# Patient Record
Sex: Female | Born: 1960 | Race: White | Hispanic: No | Marital: Married | Smoking: Never smoker
Health system: Southern US, Community
[De-identification: ages and names within clinical notes are randomized; demographics above are authoritative.]

## PROBLEM LIST (undated history)

## (undated) DIAGNOSIS — E785 Hyperlipidemia, unspecified: Secondary | ICD-10-CM

## (undated) DIAGNOSIS — Z8619 Personal history of other infectious and parasitic diseases: Secondary | ICD-10-CM

## (undated) HISTORY — DX: Hyperlipidemia, unspecified: E78.5

## (undated) HISTORY — PX: COLONOSCOPY: SHX174

## (undated) HISTORY — PX: ABLATION: SHX5711

## (undated) HISTORY — DX: Personal history of other infectious and parasitic diseases: Z86.19

## (undated) HISTORY — PX: TUBAL LIGATION: SHX77

## (undated) HISTORY — PX: OVARIAN CYST REMOVAL: SHX89

---

## 1999-01-31 ENCOUNTER — Other Ambulatory Visit: Admission: RE | Admit: 1999-01-31 | Discharge: 1999-01-31 | Payer: Self-pay | Admitting: Family Medicine

## 2000-05-31 ENCOUNTER — Encounter (INDEPENDENT_AMBULATORY_CARE_PROVIDER_SITE_OTHER): Payer: Self-pay

## 2000-05-31 ENCOUNTER — Inpatient Hospital Stay (HOSPITAL_COMMUNITY): Admission: AD | Admit: 2000-05-31 | Discharge: 2000-06-02 | Payer: Self-pay | Admitting: Obstetrics and Gynecology

## 2000-07-10 ENCOUNTER — Other Ambulatory Visit: Admission: RE | Admit: 2000-07-10 | Discharge: 2000-07-10 | Payer: Self-pay | Admitting: Obstetrics & Gynecology

## 2000-07-28 ENCOUNTER — Encounter: Admission: RE | Admit: 2000-07-28 | Discharge: 2000-10-06 | Payer: Self-pay | Admitting: Obstetrics and Gynecology

## 2000-10-25 ENCOUNTER — Encounter: Admission: RE | Admit: 2000-10-25 | Discharge: 2000-11-24 | Payer: Self-pay | Admitting: Obstetrics and Gynecology

## 2002-05-03 ENCOUNTER — Other Ambulatory Visit: Admission: RE | Admit: 2002-05-03 | Discharge: 2002-05-03 | Payer: Self-pay | Admitting: Obstetrics and Gynecology

## 2002-10-06 ENCOUNTER — Other Ambulatory Visit: Admission: RE | Admit: 2002-10-06 | Discharge: 2002-10-06 | Payer: Self-pay | Admitting: Obstetrics and Gynecology

## 2003-06-22 ENCOUNTER — Other Ambulatory Visit: Admission: RE | Admit: 2003-06-22 | Discharge: 2003-06-22 | Payer: Self-pay | Admitting: Obstetrics and Gynecology

## 2004-07-11 ENCOUNTER — Other Ambulatory Visit: Admission: RE | Admit: 2004-07-11 | Discharge: 2004-07-11 | Payer: Self-pay | Admitting: Obstetrics and Gynecology

## 2005-07-23 ENCOUNTER — Other Ambulatory Visit: Admission: RE | Admit: 2005-07-23 | Discharge: 2005-07-23 | Payer: Self-pay | Admitting: Obstetrics and Gynecology

## 2005-08-07 ENCOUNTER — Encounter (INDEPENDENT_AMBULATORY_CARE_PROVIDER_SITE_OTHER): Payer: Self-pay | Admitting: Specialist

## 2005-08-08 ENCOUNTER — Ambulatory Visit (HOSPITAL_COMMUNITY): Admission: RE | Admit: 2005-08-08 | Discharge: 2005-08-08 | Payer: Self-pay | Admitting: Obstetrics and Gynecology

## 2006-10-23 ENCOUNTER — Encounter (INDEPENDENT_AMBULATORY_CARE_PROVIDER_SITE_OTHER): Payer: Self-pay | Admitting: Obstetrics and Gynecology

## 2006-10-23 ENCOUNTER — Ambulatory Visit (HOSPITAL_COMMUNITY): Admission: RE | Admit: 2006-10-23 | Discharge: 2006-10-24 | Payer: Self-pay | Admitting: Obstetrics and Gynecology

## 2010-03-26 ENCOUNTER — Ambulatory Visit: Payer: Self-pay | Admitting: Family Medicine

## 2010-03-26 ENCOUNTER — Encounter (INDEPENDENT_AMBULATORY_CARE_PROVIDER_SITE_OTHER): Payer: Self-pay | Admitting: *Deleted

## 2010-03-26 DIAGNOSIS — R0789 Other chest pain: Secondary | ICD-10-CM

## 2010-03-26 DIAGNOSIS — E785 Hyperlipidemia, unspecified: Secondary | ICD-10-CM | POA: Insufficient documentation

## 2010-03-26 DIAGNOSIS — Z9189 Other specified personal risk factors, not elsewhere classified: Secondary | ICD-10-CM | POA: Insufficient documentation

## 2010-03-29 ENCOUNTER — Ambulatory Visit: Payer: Self-pay | Admitting: Family Medicine

## 2010-03-29 ENCOUNTER — Encounter (INDEPENDENT_AMBULATORY_CARE_PROVIDER_SITE_OTHER): Payer: Self-pay

## 2010-03-29 LAB — CONVERTED CEMR LAB
Albumin: 3.9 g/dL (ref 3.5–5.2)
BUN: 16 mg/dL (ref 6–23)
CO2: 31 meq/L (ref 19–32)
Calcium: 10.1 mg/dL (ref 8.4–10.5)
Cholesterol: 209 mg/dL — ABNORMAL HIGH (ref 0–200)
Creatinine, Ser: 0.6 mg/dL (ref 0.4–1.2)
Direct LDL: 123.7 mg/dL
Glucose, Bld: 99 mg/dL (ref 70–99)
Total CHOL/HDL Ratio: 3
Total Protein: 6.8 g/dL (ref 6.0–8.3)
Triglycerides: 39 mg/dL (ref 0.0–149.0)

## 2010-04-02 ENCOUNTER — Ambulatory Visit: Payer: Self-pay | Admitting: Internal Medicine

## 2010-04-08 ENCOUNTER — Ambulatory Visit: Payer: Self-pay | Admitting: Internal Medicine

## 2010-07-09 NOTE — Letter (Signed)
Summary: Pre Visit Letter Revised  [facility]  [address] [place], [place] [address]   Phone: [phone]  Fax: [phone]        03/26/2010 MRN: [id number] Cristina Hayes [address] [place]  [address]             Procedure Date:  04/08/10  Welcome to the Gastroenterology Division at [facility].    You are scheduled to see a nurse for your pre-procedure visit on 04/02/10 at 2:30 PM on the 3rd floor at [facility], [address]  We ask that you try to arrive at our office 15 minutes prior to your appointment time to allow for check-in.  Please take a minute to review the attached form.  If you answer "Yes" to one or more of the questions on the first page, we ask that you call the person listed at your earliest opportunity.  If you answer "No" to all of the questions, please complete the rest of the form and bring it to your appointment.    Your nurse visit will consist of discussing your medical and surgical history, your immediate family medical history, and your medications.   If you are unable to list all of your medications on the form, please bring the medication bottles to your appointment and we will list them.  We will need to be aware of both prescribed and over the counter drugs.  We will need to know exact dosage information as well.    Please be prepared to read and sign documents such as consent forms, a financial agreement, and acknowledgement forms.  If necessary, and with your consent, a friend or relative is welcome to sit-in on the nurse visit with you.  Please bring your insurance card so that we may make a copy of it.  If your insurance requires a referral to see a specialist, please bring your referral form from your primary care physician.  No co-pay is required for this nurse visit.     If you cannot keep your appointment, please call [phone] to cancel or reschedule prior to your appointment date.  This allows  [place] the opportunity to schedule an appointment for another patient in need of care.    Thank you for choosing [facility] for your medical needs.  We appreciate the opportunity to care for you.  Please visit [place] at our website  to learn more about our practice.  Sincerely, The Gastroenterology Division

## 2010-07-09 NOTE — Letter (Signed)
Summary: [facility] Instructions  [facility]  [address] [place], [place] [address]   Phone: [phone]  Fax: [phone]       Cristina Hayes    09-06-1960    MRN: [id number]       Procedure Day /Date: Monday 04-08-10     Arrival Time:  8:30 am     Procedure Time: 9:30 am     Location of Procedure:                    _x _   [facility]oscopy Center (4th Floor)   PREPARATION FOR COLONOSCOPY WITH MIRALAX  Starting 5 days prior to your procedure  04-03-10 do not eat nuts, seeds, popcorn, corn, beans, peas,  salads, or any raw vegetables.  Do not take any fiber supplements (e.g. Metamucil, Citrucel, and Benefiber). ____________________________________________________________________________________________________   THE DAY BEFORE YOUR PROCEDURE         DATE:  04-07-10  DAY:  Sunday  1   Drink clear liquids the entire day-NO SOLID FOOD  2   Do not drink anything colored red or purple.  Avoid juices with pulp.  No orange juice.  3   Drink at least 64 oz. (8 glasses) of fluid/clear liquids during the day to prevent dehydration and help the prep work efficiently.  CLEAR LIQUIDS INCLUDE: Water Jello Ice Popsicles Tea (sugar ok, no milk/cream) Powdered fruit flavored drinks Coffee (sugar ok, no milk/cream) Gatorade Juice: apple, white grape, white cranberry  Lemonade Clear bullion, consomm, broth Carbonated beverages (any kind) Strained chicken noodle soup Hard Candy  4   Mix the entire bottle of Miralax with 64 oz. of Gatorade/Powerade in the morning and put in the refrigerator to chill.  5   At 3:00 pm take 2 Dulcolax/Bisacodyl tablets.  6   At 4:30 pm take one Reglan/Metoclopramide tablet.  7  Starting at 5:00 pm drink one 8 oz glass of the Miralax mixture every 15-20 minutes until you have finished drinking the entire 64 oz.  You should finish drinking prep around 7:30 or 8:00 pm.  8   If you are nauseated, you may take the 2nd  Reglan/Metoclopramide tablet at 6:30 pm.        9    At 8:00 pm take 2 more DULCOLAX/Bisacodyl tablets.     THE DAY OF YOUR PROCEDURE      DATE:   04-08-10  DAY:  Monday  You may drink clear liquids until  7:30 a.m. (2 HOURS BEFORE PROCEDURE).   MEDICATION INSTRUCTIONS  Unless otherwise instructed, you should take regular prescription medications with a small sip of water as early as possible the morning of your procedure.         OTHER INSTRUCTIONS  You will need a responsible adult at least 52 years of age to accompany you and drive you home.   This person must remain in the waiting room during your procedure.  Wear loose fitting clothing that is easily removed.  Leave jewelry and other valuables at home.  However, you may wish to bring a book to read or an iPod/MP3 player to listen to music as you wait for your procedure to start.  Remove all body piercing jewelry and leave at home.  Total time from sign-in until discharge is approximately 2-3 hours.  You should go home directly after your procedure and rest.  You can resume normal activities the day after your procedure.  The day of your procedure you should  not:   Drive   Make legal decisions   Operate machinery   Drink alcohol   Return to work  You will receive specific instructions about eating, activities and medications before you leave.   The above instructions have been reviewed and explained to me by   Ulis Rias RN  April 02, 2010 3:01 PM     I fully understand and can verbalize these instructions _____________________________ Date _______

## 2010-07-09 NOTE — Procedures (Signed)
Summary: Colonoscopy  Patient: [name] Note: All result statuses are Final unless otherwise noted.  Tests: (1) Colonoscopy (COL)   COL Colonoscopy           DONE     [facility]     [address]     [place], [place]  [address]           COLONOSCOPY PROCEDURE REPORT           PATIENT:  Cristina Hayes, Cristina Hayes  MR#:  [id number]     BIRTHDATE:  Sep 23, 1960, 49 yrs. old  GENDER:  female     ENDOSCOPIST:  Hedwig Morton. Juanda Chance, MD     REF. BY:  Excell Seltzer, M.D.     PROCEDURE DATE:  04/08/2010     PROCEDURE:  Colonoscopy [address]78[address]     ASA CLASS:  Class I     INDICATIONS:  Routine Risk Screening     MEDICATIONS:   Versed 7 mg, Fentanyl 50 mcg           DESCRIPTION OF PROCEDURE:   After the risks benefits and     alternatives of the procedure were thoroughly explained, informed     consent was obtained.  Digital rectal exam was performed and     revealed no rectal masses.   The LB [id number] endoscope     was introduced through the anus and advanced to the cecum, which     was identified by both the appendix and ileocecal valve, without     limitations.  The quality of the prep was excellent, using     MiraLax.  The instrument was then slowly withdrawn as the colon     was fully examined.     <<PROCEDUREIMAGES>>           FINDINGS:  No polyps or cancers were seen (see image1, image2, and     image3).   Retroflexed views in the rectum revealed no     abnormalities.    The scope was then withdrawn from the patient     and the procedure completed.           COMPLICATIONS:  None     ENDOSCOPIC IMPRESSION:     1) No polyps or cancers     2) Normal colonoscopy     RECOMMENDATIONS:     1) high fiber diet     REPEAT EXAM:  In 10 year(s) for.           ______________________________     Hedwig Morton. Juanda Chance, MD           CC:           n.     eSIGNED:   Hedwig Morton. Brodie at 04/08/2010 10:05 AM           Blanche East, [phone]  Note: An exclamation mark (!) indicates a  result that was not dispersed into the flowsheet. Document Creation Date: 04/08/2010 10:07 AM _______________________________________________________________________  (1) Order result status: Final Collection or observation date-time: 04/08/2010 10:01 Requested date-time:  Receipt date-time:  Reported date-time:  Referring Physician:   Ordering Physician: Lina Sar [phone]) Specimen Source:  Source: Launa Grill Order Number: [id number] Lab site:   Appended Document: Colonoscopy    Clinical Lists Changes  Observations: Added new observation of COLONNXTDUE: 03/2020 (04/08/2010 13:38)      Appended Document: Immunization Entry     Last Colonoscopy:  DONE (04/08/2010 10:01:00 AM) Colonoscopy Next Due:  10  yr

## 2010-07-09 NOTE — Assessment & Plan Note (Signed)
Summary: NEW PT TO ESTABH   Vital Signs:  Patient profile:   50 year old female Height:      64 inches Weight:      154.0 pounds BMI:     26.53 Temp:     99.1 degrees F oral Pulse rate:   72 / minute Pulse rhythm:   regular BP sitting:   110 / 70  Vitals Entered By: Benny Lennert CMA Duncan Dull) (March 26, 2010 10:02 AM) CC: NEW PATIENT    History of Present Illness: Here to establish.   High chlesterol..last checked several years ago at work.  Preventive Screening-Counseling & Management  Alcohol-Tobacco     Smoking Status: never  Caffeine-Diet-Exercise     Does Patient Exercise: yes      Drug Use:  no.    Allergies (verified): No Known Drug Allergies  Past History:  Past Medical History: Current Problems:  CHICKENPOX, HX OF (ICD-V15.9)   Hyperlipidemia  Past Surgical History: ovarian cyst, left ovary removal 2008 novasure (ablition) 2006  childbirth [id number]  Family History: father: COPD, pancreatic cancer mother:HTN siblings: HTN  Social History: 3 children..healthy Occupation: Research officer, trade [employer] Married Never Smoked Alcohol use-no Drug use-no Regular exercise-yes, 1 mile walking a day Diet: fruits and veggies.Marland Kitchendoes eat a lot of fried foods, water, minimal calcium in diet.  Occupation:  employed Smoking Status:  never Drug Use:  no Does Patient Exercise:  yes  Review of Systems General:  Denies fatigue and fever. CV:  Complains of chest pain or discomfort; In past has had some pressure in chest when lying down at night. No exertional chest pain, no SOB.Marland Kitchen Resp:  Denies shortness of breath. GI:  Denies abdominal pain, bloody stools, constipation, and diarrhea. GU:  Denies dysuria.  Physical Exam  General:  Well-developed,well-nourished,in no acute distress; alert,appropriate and cooperative throughout examination Eyes:  No corneal or conjunctival inflammation noted. EOMI. Perrla. Funduscopic exam benign, without hemorrhages, exudates  or papilledema. Vision grossly normal. Ears:  External ear exam shows no significant lesions or deformities.  Otoscopic examination reveals clear canals, tympanic membranes are intact bilaterally without bulging, retraction, inflammation or discharge. Hearing is grossly normal bilaterally. Nose:  External nasal examination shows no deformity or inflammation. Nasal mucosa are pink and moist without lesions or exudates. Mouth:  Oral mucosa and oropharynx without lesions or exudates.  Teeth in good repair. Neck:  no carotid bruit or thyromegaly no cervical or supraclavicular lymphadenopathy  Lungs:  Normal respiratory effort, chest expands symmetrically. Lungs are clear to auscultation, no crackles or wheezes. Heart:  Normal rate and regular rhythm. S1 and S2 normal without gallop, murmur, click, rub or other extra sounds. Abdomen:  Bowel sounds positive,abdomen soft and non-tender without masses, organomegaly or hernias noted. Msk:  No deformity or scoliosis noted of thoracic or lumbar spine.   Pulses:  R and L posterior tibial pulses are full and equal bilaterally  Extremities:  no edmea Skin:  Intact without suspicious lesions or rashes Psych:  Cognition and judgment appear intact. Alert and cooperative with normal attention span and concentration. No apparent delusions, illusions, hallucinations   Impression & Recommendations:  Problem # 1:  HYPERLIPIDEMIA (ICD-272.4)  Problem # 2:  SCREENING, COLON CANCER (ICD-V76.51)  Orders: Gastroenterology Referral (GI)  Problem # 3:  CHEST PAIN, ATYPICAL (ICD-786.59) Most likely due to GERD. Work on lifestyle change..if not improving may try prilosec and return for follow up appt. No EKG done as no exeertional component.  Other Orders: Flu Vaccine 62yrs + (57846)  Admin 1st Vaccine ([address]) Tdap => 24yrs IM (60454) Admin of Any Addtl Vaccine (09811)  Patient Instructions: 1)  Fasting lipids, CMET Dx 272.0 2)  Calcium and vit D 600 mg /400  International Units once daily to two times a day  3)  Referral Appointment Information 4)  Day/Date: 5)  Time: 6)  Place/MD: 7)  Address: 8)  Phone/Fax: 9)  Patient given appointment information. Information/Orders faxed/mailed.  10)  Avoids..peppermoint, citris, tomatos, spicy foods. Eat small meals.  11)  Don't eat immediately prior to laying down at night.  12)  MAke a follow up appt if chest pain returned and not better with prilosec.   Orders Added: 1)  Flu Vaccine 22yrs + [90658] 2)  Admin 1st Vaccine [90471] 3)  Gastroenterology Referral [GI] 4)  Tdap => 61yrs IM [90715] 5)  Admin of Any Addtl Vaccine [90472] 6)  New Patient Level III [99203]   Immunizations Administered:  Influenza Vaccine # 1:    Vaccine Type: Fluvax 3+    Site: left deltoid    Mfr: GlaxoSmithKline    Dose: 0.5 ml    Route: IM    Given by: Benny Lennert CMA (AAMA)    Exp. Date: 12/07/2010    Lot #: [id number]    VIS given: 01/01/10 version given March 26, 2010.  Tetanus Vaccine:    Vaccine Type: Tdap  Flu Vaccine Consent Questions:    Do you have a history of severe allergic reactions to this vaccine? no    Any prior history of allergic reactions to egg and/or gelatin? no    Do you have a sensitivity to the preservative Thimersol? no    Do you have a past history of Guillan-Barre Syndrome? no    Do you currently have an acute febrile illness? no    Have you ever had a severe reaction to latex? no    Vaccine information given and explained to patient? yes    Are you currently pregnant? no   Immunizations Administered:  Influenza Vaccine # 1:    Vaccine Type: Fluvax 3+    Site: left deltoid    Mfr: GlaxoSmithKline    Dose: 0.5 ml    Route: IM    Given by: Benny Lennert CMA (AAMA)    Exp. Date: 12/07/2010    Lot #: [id number]    VIS given: 01/01/10 version given March 26, 2010.  Tetanus Vaccine:    Vaccine Type: Tdap  Prior Medications (reviewed today): None Current  Allergies (reviewed today): No known allergies   Flu Vaccine Result Date:  03/26/2010 Flu Vaccine Result:  given Flu Vaccine Next Due:  1 yr TD Result Date:  03/26/2010 TD Result:  given TD Next Due:  10 yr Flex Sig Next Due:  Not Indicated Hemoccult Next Due:  Not Indicated PAP Result Date:  11/21/2009 PAP Result:  normal PAP Next Due:  1 yr Mammogram Result Date:  11/21/2009 Mammogram Result:  normal Mammogram Next Due:  1 yr  Appended Document: NEW PT TO ESTABH    Clinical Lists Changes  Orders: Added new Service order of Tdap => 65yrs IM ([address]) - Signed Added new Service order of Admin 1st Vaccine (28413) - Signed Observations: Added new observation of TD BOOST VIS: 04/26/08 version given March 26, 2010. (03/26/2010 11:31) Added new observation of TD BOOSTERLO: [id number] (03/26/2010 11:31) Added new observation of TD BOOST EXP: 03/28/2012 (03/26/2010 11:31) Added new observation of TD BOOSTERBY: Heather Woodard CMA (AAMA) (03/26/2010 11:31) Added new observation  of TD BOOSTERRT: IM (03/26/2010 11:31) Added new observation of TDBOOSTERDSE: 0.5 ml (03/26/2010 11:31) Added new observation of TD BOOSTERMF: GlaxoSmithKline (03/26/2010 11:31) Added new observation of TD BOOST SIT: right deltoid (03/26/2010 11:31) Added new observation of TD BOOSTER: Tdap (03/26/2010 11:31)       Immunizations Administered:  Tetanus Vaccine:    Vaccine Type: Tdap    Site: right deltoid    Mfr: GlaxoSmithKline    Dose: 0.5 ml    Route: IM    Given by: Benny Lennert CMA (AAMA)    Exp. Date: 03/28/2012    Lot #: [id number]    VIS given: 04/26/08 version given March 26, 2010.

## 2010-07-09 NOTE — Miscellaneous (Signed)
Summary: Lec previsit  Clinical Lists Changes  Medications: Added new medication of MIRALAX   POWD (POLYETHYLENE GLYCOL 3350) As per prep  instructions. - Signed Added new medication of REGLAN 10 MG  TABS (METOCLOPRAMIDE HCL) As per prep instructions. - Signed Added new medication of DULCOLAX 5 MG  TBEC (BISACODYL) Day before procedure take 2 at 3pm and 2 at 8pm. - Signed Rx of MIRALAX   POWD (POLYETHYLENE GLYCOL 3350) As per prep  instructions.;  #255gm x 0;  Signed;  Entered by: Ulis Rias RN;  Authorized by: Hart Carwin MD;  Method used: Electronically to [id number] [address], [place], [place], [place]  [address], Ph: [phone], Fax: [phone] Rx of REGLAN 10 MG  TABS (METOCLOPRAMIDE HCL) As per prep instructions.;  #2 x 0;  Signed;  Entered by: Ulis Rias RN;  Authorized by: Hart Carwin MD;  Method used: Electronically to [id number] [address] [place], [place], [place]  [address], Ph: [phone], Fax: [phone] Rx of DULCOLAX 5 MG  TBEC (BISACODYL) Day before procedure take 2 at 3pm and 2 at 8pm.;  #4 x 0;  Signed;  Entered by: Ulis Rias RN;  Authorized by: Hart Carwin MD;  Method used: [facility] [phone]*, [address] [place], [place], [place]  [address], Ph: [phone], Fax: [phone] Observations: Added new observation of NKA: T (04/02/2010 14:17)    Prescriptions: DULCOLAX 5 MG  TBEC (BISACODYL) Day before procedure take 2 at 3pm and 2 at 8pm.  #4 x 0   Entered by:   Ulis Rias RN   Authorized by:   Hart Carwin MD   Signed by:   Ulis Rias RN on 04/02/2010   Method used:   [facility]ronically to        [facility] [address] [phone]* (retail)       [address]       [place], [place]  [address]       Ph: [phone]       Fax: [phone]   RxID:   [id number] REGLAN 10 MG  TABS (METOCLOPRAMIDE HCL) As per prep instructions.  #2 x 0   Entered  by:   Ulis Rias RN   Authorized by:   Hart Carwin MD   Signed by:   Ulis Rias RN on 04/02/2010   Method used:   [facility]ronically to        [facility]  [address] [phone]* (retail)       [address]       [place], [place]  [address]       Ph: [phone]       Fax: [phone]   RxID:   [id number] MIRALAX   POWD (POLYETHYLENE GLYCOL 3350) As per prep  instructions.  #255gm x 0   Entered by:   Ulis Rias RN   Authorized by:   Hart Carwin MD   Signed by:   Ulis Rias RN on 04/02/2010   Method used:   [facility]ronically to        [facility]  [phone]* (retail)       [address]       [place], [place]  [address]  Ph: [phone]       Fax: [phone]   RxID:   [id number]

## 2010-10-25 NOTE — Op Note (Signed)
NAME:  Cristina Hayes, Cristina Hayes NO.:  [id number]   MEDICAL RECORD NO.:  [id number]          PATIENT TYPE:  AMB   LOCATION:  SDC                           FACILITY:  [facility]   PHYSICIAN:  Miguel Aschoff, M.D.       DATE OF BIRTH:  16-Nov-1960   DATE OF PROCEDURE:  DATE OF DISCHARGE:                               OPERATIVE REPORT   PREOPERATIVE DIAGNOSIS:  Persistent left adnexal mass.   POSTOPERATIVE DIAGNOSIS:  Same   PROCEDURE:  Diagnostic laparoscopy with left salpingo-oophorectomy,  pelvic cytology.   SURGEON:  Dr. Miguel Aschoff   ASSISTANT:  Dr. Lodema Hong   ANESTHESIA:  General.   COMPLICATIONS:  None.   JUSTIFICATION:  The patient is a 50 year old white female with a history  of the persistent no left adnexal mass now 5 cm in size that has been  observed now for three months without change.  The patient has a normal  CA125.  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   PROCEDURE:  The patient is taken to the operating placed in supine  position.  General anesthesia was administered without difficulty.  She  was then placed in dorsal lithotomy position, prepped and draped in the  usual sterile fashion.  The bladder was catheterized.  At this point,  examination under anesthesia was carried out which revealed normal  external genitalia, normal Bartholin's and Skene's glands, normal  urethra, vaginal vault was without gross lesion.  The cervix was  unremarkable.  The uterus was normal size and shape.  Evaluation of the  adnexa revealed again the mass on the left and about 5 cm in size.  The  right was unremarkable.   At this point, attention was directed to the umbilicus where a small  infraumbilical incision was made.  A Veress needle was inserted, and  then the abdomen was insufflated with 3 liters of CO2.  Following  insufflation,  the trocar for the laparoscope was placed followed by the  laparoscope itself.  Then under direct visualization two accessory ports  were established.  A 5-mm port was established in the right lower  quadrant and an 11-mm port in the left lower quadrant.  Systematic  inspection of the pelvis revealed the uterus be normal size and shape.  The anterior bladder peritoneum was unremarkable.  The round ligaments  were unremarkable.  The tubes were inspected and had evidence of prior  tubal sterilization, but no lesions were noted.  The fimbria fine and  delicate.  The right ovary was totally within normal limits.  The cul-de-  sac was unremarkable.  On the left side, however, the ovary was  significantly enlarged, again 5 cm in size.  There were no external  excrescences.  The surface of the ovary was smooth and regular.   Intestinal surfaces were then inspected and appeared be within normal  limits.  The gallbladder was visualized and was noted be within normal  limits.  The liver was unremarkable.  At this point, the Gyrus surgical  unit was placed through the right lower quadrant port.  The utero-  ovarian ligament was identified, grasped and cauterized and cut and  dissection continued along these ovarian ligament with cauterization and  cutting until the infundibulopelvic ligament was reached.  At this point  the infundibulopelvic ligament was identified, the ureter, was  identified and with the ureter being out of the field, the  infundibulopelvic ligament was cauterized and cut thus freeing the  specimen.   Prior to the surgical dissection, abdominal washings were taken for  cytology.  These were removed through the right lower quadrant port via  syringe and sent for study.  Now with the specimen being excised, an  EndoCatch unit was placed into the abdomen through the left lower  quadrant port, and the specimen placed into the EndoCatch.  This was  brought out through the left lower  quadrant.  The mass was then  decompressed by aspirating fluid from the ovarian cyst.  Then once this  was done it was possible to remove the mass in toto through the 11-mm  port.  At this point, with good hemostasis and no other abnormalities  being noted, it was elected to complete the procedure.  All CO2 was  allowed to escape.  The instruments were removed.  The left lower  quadrant incision was closed by grasping fashion closing it with a 0  Vicryl suture.  The other port sites were closed using subcuticular 4-0  Vicryl sutures.  The port sites were injected with 0.25% Marcaine.  This  completed the procedure.   The patient was then reversed from the anesthetic and taken the recovery  room in satisfactory condition.   ESTIMATED BLOOD LOSS:  Was less than 20 mL.   PLAN:  Is for the patient be discharged home if she is feeling well  enough after observation in the recovery room.  She will be seen back in  four weeks for follow-up examination.  She is to call if there are any  problems such as fever, pain or heavy bleeding.  She is to call for  pathology report on Wednesday.  Medications for home include Tylox 2-3  hours as needed for pain, doxycycline one twice a day x3 days.      Miguel Aschoff, M.D.  Electronically Signed     AR/MEDQ  D:  10/23/2006  T:  10/23/2006  Job:  [id number]

## 2010-10-25 NOTE — Op Note (Signed)
[facility]  Patient:    Cristina Hayes, Cristina Hayes                   MRN: [id number] Proc. Date: 05/31/00 Adm. Date:  [id number] Attending:  Lars Pinks                           Operative Report  PREOPERATIVE DIAGNOSIS:       Voluntary sterilization.  POSTOPERATIVE DIAGNOSIS:      Voluntary sterilization.  PROCEDURE:                    Bilateral partial salpingectomy for sterilization.  SURGEON:                      Richard D. Arlyce Dice, M.D.  ANESTHESIA:                   Epidural.  ESTIMATED BLOOD LOSS:         20 cc.  FINDINGS:                     Normal-appearing fallopian tubes.  INDICATIONS:                  This is a 50 year old gravida 4, para 3 who is approximately 90 min status post spontaneous vaginal delivery of a healthy female infant.  The patient requests permanent sterilization.  A failure rate of 2:1,000 was discussed with the patient, as well as the fact the procedure was permanent and there were alternative permanent forms of birth control. Prior to the procedure the patient accepted these facts and requested to proceed.  DESCRIPTION OF PROCEDURE:     The patient was taken to the operating room where the epidural anesthesia that had been placed during labor was injected for surgical anesthesia.  The periumbilical area was prepped and draped in a sterile fashion.  A 3 cm subumbilical incision was made and carried down to the fascia.  The fascia was then entered sharply and extended bluntly.  The peritoneum was then entered sharply.  The fallopian tube on the right side was identified, grasped with the Babcock clamp and traced to its fimbriated end.  The isthmic-ampullar junction was then elevated and a knuckle of tube was doubly ligated and excised.  An identical procedure was then performed on the contralateral tube.  The peritoneum was closed with a pursestring of 0 Vicryl suture.  The fascia was closed with running 0  Vicryl suture, and the skin was reopposed with Dermabond glue.  The patient tolerated the procedure well and left the operating room in good condition. DD:  05/31/00 TD:  06/01/00 Job: [id number]

## 2010-10-25 NOTE — Op Note (Signed)
NAME:  Cristina Hayes, Cristina Hayes NO.:  [id number]   MEDICAL RECORD NO.:  [id number]          PATIENT TYPE:  OUT   LOCATION:  SDC                           FACILITY:  [facility]   PHYSICIAN:  Miguel Aschoff, M.D.       DATE OF BIRTH:  1961-02-22   DATE OF PROCEDURE:  08/08/2005  DATE OF DISCHARGE:                                 OPERATIVE REPORT   PREOPERATIVE DIAGNOSIS:  Menorrhagia.   POSTOPERATIVE DIAGNOSIS:  Menorrhagia.   PROCEDURE:  Cervical dilatation, hysteroscopy, uterine curettage, NovaSure  endometrial ablation.   SURGEON:  Dr. Miguel Aschoff   ANESTHESIA:  IV sedation with paracervical block.   COMPLICATIONS:  None.   JUSTIFICATION:  The patient is a 50 year old white female with history of  progressively heavy menses, passing large clots each cycle. She presents now  to undergo D&C, hysteroscopy, endometrial ablation to control her heavy  cycles. Risks, benefits of this procedure were discussed with the patient.  Informed consent was obtained.   PROCEDURE:  The patient was taken to the operating room and placed in the  supine position. IV sedation was administered without difficulty. She was  then placed in the dorsolithotomy position, prepped and draped in usual  sterile fashion. Examination revealed small Bartholin's gland cyst on the  left labia. Otherwise the external genitalia were within normal limits. The  vaginal vault was without lesion. Cervix was without lesion. Uterus was  noted to be globular and anterior, approximately 8-10 weeks in size. At this  point speculum was placed in the vaginal vault, anterior cervical lip was  grasped with tenaculum and cervix was injected with 20 mL of 1% Xylocaine by  placing equal amounts at 12, 4, and 8 o'clock positions on the cervix. Once  this was done the uterus was sounded to 10 cm, cervical length of 4 cm was  determined for cavity length of 6 cm. After this was done, the cervix was  further dilated and the  diagnostic hysteroscope was then used. It was  entered through the endocervix. No lesions were noted. The endometrial  cavity was visualized. No submucous myomas or polyps were noted. After  hysteroscopy, sharp curettage of uterine cavity was carried out without  difficulty. The tissue was removed for histologic study. At this point the  NovaSure endometrial ablation unit was placed into the cavity and the cavity  width of 4.7 cm was determined. Cavity assessment was then carried out and  the test passed and a treatment cycle at 155 watts for 2 minutes 12 seconds  was carried out without difficulty. At completion of the treatment cycle the  NovaSure ablation unit was removed intact. Hysteroscopy was carried out to  ensure the cavity had been completely treated and this appeared to be the  case on visualization after the ablation. At this point the tenaculum was  removed. Hemostasis was readily achieved. The patient was taken out of  lithotomy position and sent to recovery room in satisfactory condition. The  estimated blood loss was less than 20 mL. The patient tolerated the  procedure well.   Plan is for the  patient to be discharged home. She will be seen back for  follow-up in 4 weeks. She should call for any problems such as fever, pain  or heavy bleeding. Medications for home include Darvocet N 100 one every 4  hours as needed for pain, doxycycline 100 milligrams twice a day x3 days.      Miguel Aschoff, M.D.  Electronically Signed     AR/MEDQ  D:  08/08/2005  T:  08/08/2005  Job:  [id number]

## 2011-02-07 ENCOUNTER — Other Ambulatory Visit: Payer: Self-pay | Admitting: Obstetrics and Gynecology

## 2011-05-28 ENCOUNTER — Encounter: Payer: Self-pay | Admitting: Family Medicine

## 2011-05-29 ENCOUNTER — Encounter: Payer: Self-pay | Admitting: Family Medicine

## 2011-05-29 ENCOUNTER — Ambulatory Visit (INDEPENDENT_AMBULATORY_CARE_PROVIDER_SITE_OTHER): Payer: BC Managed Care – PPO | Admitting: Family Medicine

## 2011-05-29 VITALS — BP 100/62 | HR 72 | Temp 97.9°F | Ht 64.0 in | Wt 157.5 lb

## 2011-05-29 DIAGNOSIS — M542 Cervicalgia: Secondary | ICD-10-CM

## 2011-05-29 MED ORDER — MELOXICAM 15 MG PO TABS: 15.0000 mg | ORAL_TABLET | Freq: Every day | ORAL | Status: DC

## 2011-05-29 NOTE — Assessment & Plan Note (Signed)
Acute muscle strain most likely. Will treat with NSAIDs, heat and stretching exercsies. Info given. Follow up in 2 weeks if not improving or call with new symptoms.

## 2011-05-29 NOTE — Progress Notes (Signed)
  Subjective:    Patient ID: Cristina Hayes, female    DOB: 04-Aug-1960, 50 y.o.   MRN: [id number]  Neck Pain  This is a new problem. The current episode started 1 to 4 weeks ago. The problem occurs intermittently (HAs gotten more constant thatn in beginning). The problem has been gradually worsening. The pain is associated with nothing (no changes in activity or injuries known). The pain is present in the left side. The quality of the pain is described as aching, shooting and burning (up to ear and down to upper shoulder). The pain is at a severity of 3/10. The pain is mild. The symptoms are aggravated by bending. The pain is worse during the night. Stiffness is present: no stiffness. Associated symptoms include headaches. Pertinent negatives include no fever, leg pain, numbness, pain with swallowing, syncope, trouble swallowing, weakness or weight loss. She has tried NSAIDs for the symptoms. The treatment provided mild relief.   No history of neck or spine issues, no past surgeries.   Review of Systems  Constitutional: Negative for fever and weight loss.  HENT: Positive for neck pain. Negative for trouble swallowing.   Cardiovascular: Negative for syncope.  Neurological: Positive for headaches. Negative for weakness and numbness.       Objective:   Physical Exam  Constitutional: She appears well-developed and well-nourished.  HENT:  Head: Normocephalic.  Right Ear: External ear normal.  Left Ear: External ear normal.  Nose: Nose normal.  Mouth/Throat: Oropharynx is clear and moist. No oropharyngeal exudate.  Eyes: Conjunctivae are normal. Pupils are equal, round, and reactive to light.  Neck: Normal range of motion.       Mild stiffness  To left with lateral rotation  neg spurlings, no vertebral ttp.  Points to area of pain over SCM, no trapezius pain  Cardiovascular: Normal rate, regular rhythm and intact distal pulses.  Exam reveals no friction rub.   No murmur  heard. Pulmonary/Chest: Effort normal and breath sounds normal.  Musculoskeletal:       Left shoulder: Normal.       Left elbow: Normal.       Left wrist: Normal.  Neurological: She has normal strength. No sensory deficit.          Assessment & Plan:

## 2011-05-29 NOTE — Patient Instructions (Signed)
Acute muscle strain most likely. Will treat with meloxicam for inflammation daily as needed, heat and stretching exercsies.See info packet given. Follow up in 2 weeks if not improving or call with new symptoms.

## 2011-11-04 ENCOUNTER — Encounter: Payer: Self-pay | Admitting: Family Medicine

## 2011-11-04 ENCOUNTER — Ambulatory Visit (INDEPENDENT_AMBULATORY_CARE_PROVIDER_SITE_OTHER)
Admission: RE | Admit: 2011-11-04 | Discharge: 2011-11-04 | Disposition: A | Discharge status: Home / Self Care | Payer: BC Managed Care – PPO | Source: Ambulatory Visit | Attending: Family Medicine | Admitting: Family Medicine

## 2011-11-04 ENCOUNTER — Ambulatory Visit (INDEPENDENT_AMBULATORY_CARE_PROVIDER_SITE_OTHER): Payer: BC Managed Care – PPO | Admitting: Family Medicine

## 2011-11-04 VITALS — BP 120/78 | HR 87 | Temp 98.5°F | Ht 64.0 in | Wt 157.4 lb

## 2011-11-04 DIAGNOSIS — R109 Unspecified abdominal pain: Secondary | ICD-10-CM

## 2011-11-04 DIAGNOSIS — R1031 Right lower quadrant pain: Secondary | ICD-10-CM

## 2011-11-04 DIAGNOSIS — T148XXA Other injury of unspecified body region, initial encounter: Secondary | ICD-10-CM

## 2011-11-04 DIAGNOSIS — T148 Other injury of unspecified body region: Secondary | ICD-10-CM

## 2011-11-04 DIAGNOSIS — M546 Pain in thoracic spine: Secondary | ICD-10-CM | POA: Insufficient documentation

## 2011-11-04 DIAGNOSIS — W57XXXA Bitten or stung by nonvenomous insect and other nonvenomous arthropods, initial encounter: Secondary | ICD-10-CM | POA: Insufficient documentation

## 2011-11-04 LAB — POCT URINALYSIS DIPSTICK
Bilirubin, UA: NEGATIVE
Glucose, UA: NEGATIVE
Ketones, UA: NEGATIVE
Leukocytes, UA: NEGATIVE
Protein, UA: NEGATIVE

## 2011-11-04 NOTE — Progress Notes (Signed)
  Subjective:    Patient ID: Cristina Hayes, female    DOB: 03/16/1961, 51 y.o.   MRN: [id number]  HPI 51 year old female presents today with possible insect bite.  She reports noting 3 lesions in left groin x 1 week. Noted area after trimming rose bushes so thought they might be bites.  Redness is spreading in last 2 days. Area may be coming to a head, blisters at red spots. No tenderness, no discharge.  No fever.  Has not treated with any med, just washing with soap. Nothing makes it better or worse.   No DM.  Right mid back pain x 1-2 weeks, no none injury, also mild low back pain centrally.  Grabbing severe pain, constant, worsening.   Worse with movement.  No dysuria or urinary changes.  No history of kidney stones.    Review of Systems  Constitutional: Negative for fever and fatigue.  HENT: Negative for ear pain.   Eyes: Negative for pain.  Respiratory: Negative for chest tightness and shortness of breath.   Cardiovascular: Negative for chest pain, palpitations and leg swelling.  Gastrointestinal: Negative for abdominal pain.  Genitourinary: Negative for dysuria.  Musculoskeletal: Positive for back pain.       Objective:   Physical Exam  Constitutional: She is oriented to person, place, and time. She appears well-developed and well-nourished.  Eyes: Pupils are equal, round, and reactive to light.  Neck: Normal range of motion. Neck supple. No thyromegaly present.  Cardiovascular: Normal rate, regular rhythm, normal heart sounds and intact distal pulses.  Exam reveals no gallop and no friction rub.   No murmur heard. Pulmonary/Chest: Effort normal and breath sounds normal.  Abdominal: Soft. Bowel sounds are normal. There is no hepatosplenomegaly. There is tenderness in the right lower quadrant. There is CVA tenderness. There is no rebound.  Musculoskeletal:       Thoracic back: She exhibits decreased range of motion and tenderness. She exhibits no bony tenderness.        Lumbar back: She exhibits tenderness. She exhibits normal range of motion and no bony tenderness.       Back:       Area of pain.. Radiates to right anterior abdomen  Neurological: She is alert and oriented to person, place, and time. She has normal strength. No sensory deficit. She displays a negative Romberg sign.       Neg SLR, neg Faber's  Skin: Skin is warm and dry. Rash noted.       Left groin: 3 small papules in linear pattern, minimal erythema ( now fading) no pustules, no blisters, no increased warmth          Assessment & Plan:

## 2011-11-04 NOTE — Patient Instructions (Signed)
Area of insect bite improving. Watch for redness increasing , spreading, call if it is or if fever. Stop at front desk to get set up for abd/pelvic CT scan.

## 2011-11-04 NOTE — Assessment & Plan Note (Signed)
Resolving allergic reaction. Continue to follow. No clear sign of ongoing bacterial superinfection.

## 2011-11-04 NOTE — Assessment & Plan Note (Signed)
No sign of urine infection. Blood in urine may be consistent with renal stone. Given this and referred abdominal pain.. Will eval with renal noncontrast CT.  If negative will treat as muscle spasm.

## 2012-04-16 ENCOUNTER — Other Ambulatory Visit: Payer: Self-pay | Admitting: Obstetrics and Gynecology

## 2013-01-21 ENCOUNTER — Encounter (HOSPITAL_COMMUNITY): Payer: Self-pay

## 2013-01-21 ENCOUNTER — Emergency Department (HOSPITAL_COMMUNITY)
Admission: EM | Admit: 2013-01-21 | Discharge: 2013-01-21 | Disposition: A | Discharge status: Home / Self Care | Payer: BC Managed Care – PPO | Attending: Emergency Medicine | Admitting: Emergency Medicine

## 2013-01-21 ENCOUNTER — Telehealth: Payer: Self-pay | Admitting: Family Medicine

## 2013-01-21 ENCOUNTER — Emergency Department (HOSPITAL_COMMUNITY): Payer: BC Managed Care – PPO

## 2013-01-21 DIAGNOSIS — Z862 Personal history of diseases of the blood and blood-forming organs and certain disorders involving the immune mechanism: Secondary | ICD-10-CM | POA: Insufficient documentation

## 2013-01-21 DIAGNOSIS — Z8639 Personal history of other endocrine, nutritional and metabolic disease: Secondary | ICD-10-CM | POA: Insufficient documentation

## 2013-01-21 DIAGNOSIS — M79602 Pain in left arm: Secondary | ICD-10-CM

## 2013-01-21 DIAGNOSIS — M542 Cervicalgia: Secondary | ICD-10-CM

## 2013-01-21 DIAGNOSIS — M79609 Pain in unspecified limb: Secondary | ICD-10-CM | POA: Insufficient documentation

## 2013-01-21 DIAGNOSIS — R0789 Other chest pain: Secondary | ICD-10-CM | POA: Insufficient documentation

## 2013-01-21 DIAGNOSIS — Z8619 Personal history of other infectious and parasitic diseases: Secondary | ICD-10-CM | POA: Insufficient documentation

## 2013-01-21 DIAGNOSIS — R079 Chest pain, unspecified: Secondary | ICD-10-CM

## 2013-01-21 LAB — CBC
HCT: 40.1 % (ref 36.0–46.0)
Hemoglobin: 13.7 g/dL (ref 12.0–15.0)
MCH: 29.3 pg (ref 26.0–34.0)
MCHC: 34.2 g/dL (ref 30.0–36.0)
MCV: 85.7 fL (ref 78.0–100.0)
Platelets: 252 10*3/uL (ref 150–400)
RBC: 4.68 MIL/uL (ref 3.87–5.11)
RDW: 12.8 % (ref 11.5–15.5)
WBC: 5.8 10*3/uL (ref 4.0–10.5)

## 2013-01-21 LAB — COMPREHENSIVE METABOLIC PANEL
ALT: 18 U/L (ref 0–35)
AST: 21 U/L (ref 0–37)
Albumin: 3.9 g/dL (ref 3.5–5.2)
Alkaline Phosphatase: 109 U/L (ref 39–117)
BUN: 18 mg/dL (ref 6–23)
CO2: 30 mEq/L (ref 19–32)
Calcium: 10.2 mg/dL (ref 8.4–10.5)
Chloride: 103 mEq/L (ref 96–112)
Creatinine, Ser: 0.69 mg/dL (ref 0.50–1.10)
GFR calc Af Amer: >90 mL/min (ref >90)
GFR calc non Af Amer: >90 mL/min (ref >90)
Glucose, Bld: 95 mg/dL (ref 70–99)
Potassium: 4.1 mEq/L (ref 3.5–5.1)
Sodium: 142 mEq/L (ref 135–145)
Total Bilirubin: 1.5 mg/dL — ABNORMAL HIGH (ref 0.3–1.2)
Total Protein: 6.8 g/dL (ref 6.0–8.3)

## 2013-01-21 LAB — POCT I-STAT TROPONIN I: Troponin i, poc: 0 ng/mL (ref 0.00–0.08)

## 2013-01-21 MED ORDER — CYCLOBENZAPRINE HCL 10 MG PO TABS
10.0000 mg | ORAL_TABLET | Freq: Once | ORAL | Status: AC
  Administered 2013-01-21: 10 mg via ORAL
  Filled 2013-01-21: qty 1

## 2013-01-21 MED ORDER — ASPIRIN 81 MG PO CHEW
324.0000 mg | CHEWABLE_TABLET | Freq: Once | ORAL | Status: AC
  Administered 2013-01-21: 324 mg via ORAL
  Filled 2013-01-21: qty 4

## 2013-01-21 NOTE — ED Notes (Signed)
Pt undressed, in gown, on monitor, continuous pulse oximetry and blood pressure cuff; family at bedside 

## 2013-01-21 NOTE — ED Notes (Signed)
Pt. Woke up this am with lt. Arm pain radiating into her lt. Jaw and lt. Chest area.  Pt.  Describes it is as burning sensation.  Pt. Denies any sob n/v.  Skin is p.w.d

## 2013-01-21 NOTE — ED Provider Notes (Signed)
CSN: [id number]     Arrival date & time 01/21/13  1026 History     First MD Initiated Contact with Patient 01/21/13 1033     Chief Complaint  Patient presents with  . Chest Pain   (Consider location/radiation/quality/duration/timing/severity/associated sxs/prior Treatment) HPI 52 year old female presents with neck, Left arm, and chest pain. Patient reports that she has had intermittent left sided neck pain for approximately 1 month.  This am, however, she noted some left arm pain and left sided chest pain.   Chest pain described as "discomfort" mild in severity.  Patient seems to suggest that her complaints are not related.  Her pain was present early this am (~130). No associated n/v, diaphoresis. Not associated with exertion.  No interventions tried.  Pain intermittent.   In regards to arm pain, it is described as mild.  Associated "fluffiness", no numbness, tingling or weakness. Patient called her PCP to be evaluated and they recommended she go to the ED.    Past Medical History  Diagnosis Date  . Hyperlipidemia   . History of chicken pox    Past Surgical History  Procedure Laterality Date  . Ovarian cyst removal     Family History  Problem Relation Age of Onset  . Hypertension Mother   . COPD Father   . Cancer Father     pancreatic  . Hypertension Sister    History  Substance Use Topics  . Smoking status: Never Smoker   . Smokeless tobacco: Not on file  . Alcohol Use: No   OB History   Grav Para Term Preterm Abortions TAB SAB Ect Mult Living                 Review of Systems  Constitutional: Negative for fever and chills.  HENT: Positive for neck pain.   Respiratory: Negative for chest tightness.   Gastrointestinal: Negative for nausea, vomiting, abdominal pain and diarrhea.  Genitourinary: Negative for difficulty urinating.  Skin: Negative for rash.  Neurological: Negative for weakness and headaches.   Allergies  Review of patient's allergies indicates no  known allergies.  Home Medications   Current Outpatient Rx  Name  Route  Sig  Dispense  Refill  . ibuprofen (ADVIL,MOTRIN) 200 MG tablet   Oral   Take 100 mg by mouth every 6 (six) hours as needed for pain.          BP 140/75  Pulse 74  Temp(Src) 98.2 F (36.8 C) (Oral)  Resp 18  SpO2 99% Physical Exam  Constitutional: She is oriented to person, place, and time. She appears well-developed and well-nourished.  HENT:  Head: Normocephalic and atraumatic.  Cardiovascular: Normal rate and regular rhythm.   No murmur heard. Pulmonary/Chest: Effort normal and breath sounds normal. She has no wheezes. She has no rales.  Abdominal: Soft. Bowel sounds are normal. She exhibits no distension. There is no tenderness.  Musculoskeletal: She exhibits no edema.  Neck - normal ROM; Negative Spurling's test.  Neurological: She is alert and oriented to person, place, and time.  Normal muscle strength throughout.   ED Course   Procedures (including critical care time)  Date: 01/21/2013  Rate: 71  Rhythm: normal sinus rhythm  QRS Axis: normal  Intervals: normal  ST/T Wave abnormalities: normal  Conduction Disutrbances:none  Narrative Interpretation: Normal sinus rhythm with no signs of ischemia  Old EKG Reviewed: none available  Labs Reviewed  CBC  COMPREHENSIVE METABOLIC PANEL  POCT I-STAT TROPONIN I   No  results found. No diagnosis found.  MDM  52 year old female presents with left arm, left neck and left sided chest pain. - Obtaining ACS workup - CBC, CMP, Chest xray. - Troponin already obtained and is negative. EKG negative as well.   1220 - Work up negative.  Low likelihood of cardiac event given history.  Patient also has no cardiac risk factors.  Will give Aspirin and I advised daily aspirin and follow up with PCP.      Tommie Sams, DO 01/21/13 1240

## 2013-01-21 NOTE — Telephone Encounter (Signed)
Patient Information:  Caller Name: Cristina Hayes  Phone: [phone]  Patient: Cristina Hayes  Gender: Female  DOB: 10/18/1960  Age: 52 Years  PCP: Kerby Nora [facility])  Pregnant: No  Office Follow Up:  Does the office need to follow up with this patient?: No  Instructions For The Office: N/A  RN Note:  Uterine ablation. Advised to call 911 now; refused to call 911 even after informed of risks of delayed care including death; stated will have husband drive her to [facility] ED now.  Symptoms  Reason For Call & Symptoms: Burning, aching pain in upper left arm, neck and mild chest discomfort since 0130.  Feft hand feels "fuzzy."  Reviewed Health History In EMR: Yes  Reviewed Medications In EMR: Yes  Reviewed Allergies In EMR: Yes  Reviewed Surgeries / Procedures: Yes  Date of Onset of Symptoms: 01/21/2013 OB / GYN:  LMP: Unknown  Guideline(s) Used:  Chest Pain  Disposition Per Guideline:   Call EMS 911 Now  Reason For Disposition Reached:   Chest pain lasting longer than 5 minutes and ANY of the following:  Over 71 years old Over 2 years old and at least one cardiac risk factor (i.e., high blood pressure, diabetes, high cholesterol, obesity, smoker or strong family history of heart disease) Pain is crushing, pressure-like, or heavy  Took nitroglycerin and chest pain was not relieved History of heart disease (i.e., angina, heart attack, bypass surgery, angioplasty, CHF)  Advice Given:  N/A  Patient Refused Recommendation:  Patient Will Go To ED  Will have husbnad drive her to [facility] ED

## 2013-01-21 NOTE — ED Provider Notes (Signed)
I saw and evaluated the patient, reviewed the resident's note and I agree with the findings and plan. I reviewed the pt's EKG.   52 year old female with neck pain was begun on her period of several months. Very low suspicion for emergent etiology. Since early this morning at approximately 1:30 AM she's been having left-sided chest pressure and also the same pressure sensation in her left shoulder. His waxed and waned since onset but has never completely resolved. No associated symptoms and no change with exertion. Troponin normal more than 9 hours after the onset of symptoms. EKG is unrevealing. Doubt ACS. Doubt dissection. Doubt pulmonary embolism. Doubt infectious. Feel patient is appropriate for outpatient followup at this time. Return precautions discussed. Outpatient followup with PCP to discuss stress testing.    Raeford Razor, MD 01/21/13 [id number]

## 2013-01-21 NOTE — Telephone Encounter (Signed)
Will await records from ER

## 2014-03-10 ENCOUNTER — Encounter: Payer: Self-pay | Admitting: Internal Medicine

## 2014-03-10 ENCOUNTER — Ambulatory Visit (INDEPENDENT_AMBULATORY_CARE_PROVIDER_SITE_OTHER): Payer: BC Managed Care – PPO | Admitting: Internal Medicine

## 2014-03-10 VITALS — BP 112/68 | HR 81 | Temp 98.1°F | Wt 160.0 lb

## 2014-03-10 DIAGNOSIS — L03011 Cellulitis of right finger: Secondary | ICD-10-CM

## 2014-03-10 DIAGNOSIS — IMO0001 Reserved for inherently not codable concepts without codable children: Secondary | ICD-10-CM

## 2014-03-10 MED ORDER — AMOXICILLIN-POT CLAVULANATE 875-125 MG PO TABS: 1.0000 | ORAL_TABLET | Freq: Two times a day (BID) | ORAL | Status: DC

## 2014-03-10 NOTE — Progress Notes (Signed)
Pre visit review using our clinic review tool, if applicable. No additional management support is needed unless otherwise documented below in the visit note. 

## 2014-03-10 NOTE — Progress Notes (Signed)
Subjective:    Patient ID: Cristina Hayes, female    DOB: 09-07-1960, 53 y.o.   MRN: [id number]  HPI  Pt presents to the clinic today with c/o redness and swelling of her ring finger on her right hand. She noticed this 3days ago. It is located around her cuticle. She describes the pain as throbbing. She denies any injury to the area. She has tried antibiotic cream without relief.  Review of Systems      Past Medical History  Diagnosis Date  . Hyperlipidemia   . History of chicken pox     Current Outpatient Prescriptions  Medication Sig Dispense Refill  . ibuprofen (ADVIL,MOTRIN) 200 MG tablet Take 100 mg by mouth every 6 (six) hours as needed for pain.       No current facility-administered medications for this visit.    No Known Allergies  Family History  Problem Relation Age of Onset  . Hypertension Mother   . COPD Father   . Cancer Father     pancreatic  . Hypertension Sister     History   Social History  . Marital Status: Married    Spouse Name: N/A    Number of Children: 3  . Years of Education: N/A   Occupational History  . teachers assistant    Social History Main Topics  . Smoking status: Never Smoker   . Smokeless tobacco: Not on file  . Alcohol Use: No  . Drug Use: No  . Sexual Activity: Not on file   Other Topics Concern  . Not on file   Social History Narrative   Regular exercise-yes, 1 mile waliking a day   Diet: fruits and veggies.Marland Kitchendoes eat a lot of fried foods,water,minimal calcium in diet     Constitutional: Denies fever, malaise, fatigue, headache or abrupt weight changes.  Skin: Pt reports redness of ring finger. Denies rashes, lesions or ulcercations.    No other specific complaints in a complete review of systems (except as listed in HPI above).  Objective:   Physical Exam   BP 112/68  Pulse 81  Temp(Src) 98.1 F (36.7 C) (Oral)  Wt 160 lb (72.576 kg)  SpO2 98% Wt Readings from Last 3 Encounters:  03/10/14 160  lb (72.576 kg)  11/04/11 157 lb 6.4 oz (71.396 kg)  05/29/11 157 lb 8 oz (71.442 kg)    General: Appears her stated age, well developed, well nourished in NAD. Skin: Paronychia of fourth finger, right hand. Cardiovascular: Normal rate and rhythm. S1,S2 noted.  No murmur, rubs or gallops noted.  Pulmonary/Chest: Normal effort and positive vesicular breath sounds. No respiratory distress. No wheezes, rales or ronchi noted.    BMET    Component Value Date/Time   NA 142 01/21/2013 1058   K 4.1 01/21/2013 1058   CL 103 01/21/2013 1058   CO2 30 01/21/2013 1058   GLUCOSE 95 01/21/2013 1058   BUN 18 01/21/2013 1058   CREATININE 0.69 01/21/2013 1058   CALCIUM 10.2 01/21/2013 1058   GFRNONAA >90 01/21/2013 1058   GFRAA >90 01/21/2013 1058    Lipid Panel     Component Value Date/Time   CHOL 209* 03/29/2010 0846   TRIG 39.0 03/29/2010 0846   HDL 67.60 03/29/2010 0846   CHOLHDL 3 03/29/2010 0846   VLDL 7.8 03/29/2010 0846    CBC    Component Value Date/Time   WBC 5.8 01/21/2013 1058   RBC 4.68 01/21/2013 1058   HGB 13.7 01/21/2013 1058  HCT 40.1 01/21/2013 1058   PLT 252 01/21/2013 1058   MCV 85.7 01/21/2013 1058   MCH 29.3 01/21/2013 1058   MCHC 34.2 01/21/2013 1058   RDW 12.8 01/21/2013 1058    Hgb A1C No results found for this basename: HGBA1C        Assessment & Plan:   Paronychia:  Soak in warm water TID Ibuprofen for pain Augmentin BID x 10 days  RTC as needed or if symptoms persist or worsen

## 2014-03-10 NOTE — Patient Instructions (Addendum)

## 2014-04-21 ENCOUNTER — Ambulatory Visit (INDEPENDENT_AMBULATORY_CARE_PROVIDER_SITE_OTHER): Payer: BC Managed Care – PPO | Admitting: Family Medicine

## 2014-04-21 ENCOUNTER — Encounter: Payer: Self-pay | Admitting: Family Medicine

## 2014-04-21 VITALS — BP 110/64 | HR 81 | Temp 98.5°F | Ht 63.0 in | Wt 162.2 lb

## 2014-04-21 DIAGNOSIS — L03011 Cellulitis of right finger: Secondary | ICD-10-CM

## 2014-04-21 MED ORDER — CLOBETASOL PROPIONATE 0.05 % EX CREA: 1.0000 "application " | TOPICAL_CREAM | Freq: Two times a day (BID) | CUTANEOUS | Status: DC

## 2014-04-21 NOTE — Patient Instructions (Addendum)
Patients should be advised to keep their hands as dry as possible and to use gloves for all wet work.  Start topical steroid cream twice daily on nail fold. Expect need for 6-9 weeks or more of treatment needed.     Paronychia Paronychia is an inflammatory reaction involving the folds of the skin surrounding the fingernail. This is commonly caused by an infection in the skin around a nail. The most common cause of paronychia is frequent wetting of the hands (as seen with bartenders, food servers, nurses or others who wet their hands). This makes the skin around the fingernail susceptible to infection by bacteria (germs) or fungus. Other predisposing factors are:  Aggressive manicuring.  Nail biting.  Thumb sucking. The most common cause is a staphylococcal (a type of germ) infection, or a fungal (Candida) infection. When caused by a germ, it usually comes on suddenly with redness, swelling, pus and is often painful. It may get under the nail and form an abscess (collection of pus), or form an abscess around the nail. If the nail itself is infected with a fungus, the treatment is usually prolonged and may require oral medicine for up to one year. Your caregiver will determine the length of time treatment is required. The paronychia caused by bacteria (germs) may largely be avoided by not pulling on hangnails or picking at cuticles. When the infection occurs at the tips of the finger it is called felon. When the cause of paronychia is from the herpes simplex virus (HSV) it is called herpetic whitlow. TREATMENT  When an abscess is present treatment is often incision and drainage. This means that the abscess must be cut open so the pus can get out. When this is done, the following home care instructions should be followed. HOME CARE INSTRUCTIONS   It is important to keep the affected fingers very dry. Rubber or plastic gloves over cotton gloves should be used whenever the hand must be placed in  water.  Keep wound clean, dry and dressed as suggested by your caregiver between warm soaks or warm compresses.  Soak in warm water for fifteen to twenty minutes three to four times per day for bacterial infections. Fungal infections are very difficult to treat, so often require treatment for long periods of time.  For bacterial (germ) infections take antibiotics (medicine which kill germs) as directed and finish the prescription, even if the problem appears to be solved before the medicine is gone.  Only take over-the-counter or prescription medicines for pain, discomfort, or fever as directed by your caregiver. SEEK IMMEDIATE MEDICAL CARE IF:  You have redness, swelling, or increasing pain in the wound.  You notice pus coming from the wound.  You have a fever.  You notice a bad smell coming from the wound or dressing. Document Released: 11/19/2000 Document Revised: 08/18/2011 Document Reviewed: 07/21/2008 [facility] Patient Information 2015 [place], [place]. This information is not intended to replace advice given to you by your health care provider. Make sure you discuss any questions you have with your health care provider.

## 2014-04-21 NOTE — Progress Notes (Signed)
Pre visit review using our clinic review tool, if applicable. No additional management support is needed unless otherwise documented below in the visit note. 

## 2014-04-21 NOTE — Assessment & Plan Note (Signed)
Keep hands dry. Treat with steroid cream. Reviewed uptodate recommendations on this issue.

## 2014-04-21 NOTE — Progress Notes (Addendum)
   Subjective:    Patient ID: Cristina Hayes, female    DOB: 06/21/1960, 53 y.o.   MRN: [id number]  HPI   53 year old female presents with new onset swollen 4th digit, swelling at nail edge off and on since 12/2013   She was seen for similar issue 1 months ago,  took antibiotics (augmentin x 10 days), she had also applied antibiotics cream, has soaked it.. Had improvement but then it has come and gone 3 times since.  This times it has gotten red, warmer than other skin, mild throbbing pain.  No discharge.  No fever.  No redness spreading.       Review of Systems  Constitutional: Negative for fever and fatigue.  HENT: Negative for ear pain.   Eyes: Negative for pain.  Respiratory: Negative for chest tightness and shortness of breath.   Cardiovascular: Negative for chest pain, palpitations and leg swelling.  Gastrointestinal: Negative for abdominal pain.  Genitourinary: Negative for dysuria.       Objective:   Physical Exam  Constitutional: Vital signs are normal. She appears well-developed and well-nourished. She is cooperative.  Non-toxic appearance. She does not appear ill. No distress.  HENT:  Head: Normocephalic.  Right Ear: Hearing, tympanic membrane, external ear and ear canal normal. Tympanic membrane is not erythematous, not retracted and not bulging.  Left Ear: Hearing, tympanic membrane, external ear and ear canal normal. Tympanic membrane is not erythematous, not retracted and not bulging.  Nose: No mucosal edema or rhinorrhea. Right sinus exhibits no maxillary sinus tenderness and no frontal sinus tenderness. Left sinus exhibits no maxillary sinus tenderness and no frontal sinus tenderness.  Mouth/Throat: Uvula is midline, oropharynx is clear and moist and mucous membranes are normal.  Eyes: Conjunctivae, EOM and lids are normal. Pupils are equal, round, and reactive to light. Lids are everted and swept, no foreign bodies found.  Neck: Trachea normal and normal  range of motion. Neck supple. Carotid bruit is not present. No thyroid mass and no thyromegaly present.  Cardiovascular: Normal rate, regular rhythm, S1 normal, S2 normal, normal heart sounds, intact distal pulses and normal pulses.  Exam reveals no gallop and no friction rub.   No murmur heard. Pulmonary/Chest: Effort normal and breath sounds normal. No tachypnea. No respiratory distress. She has no decreased breath sounds. She has no wheezes. She has no rhonchi. She has no rales.  Abdominal: Soft. Normal appearance and bowel sounds are normal. There is no tenderness.  Neurological: She is alert.  Skin: Skin is warm, dry and intact.  Mild erythema, no warmth and no fluctuance at 4th digit left nail bed, dystrophy at mdial edge of nail bed  Psychiatric: Her speech is normal and behavior is normal. Judgment and thought content normal. Her mood appears not anxious. Cognition and memory are normal. She does not exhibit a depressed mood.          Assessment & Plan:

## 2014-10-20 ENCOUNTER — Telehealth: Payer: Self-pay | Admitting: *Deleted

## 2014-10-20 NOTE — Telephone Encounter (Signed)
Called patient to schedule screening mammogram.  Left message for patient to return my call. 

## 2014-12-05 ENCOUNTER — Encounter: Payer: Self-pay | Admitting: Internal Medicine

## 2015-08-31 ENCOUNTER — Other Ambulatory Visit: Payer: Self-pay | Admitting: Obstetrics and Gynecology

## 2015-09-03 LAB — CYTOLOGY - PAP

## 2016-09-18 ENCOUNTER — Ambulatory Visit (INDEPENDENT_AMBULATORY_CARE_PROVIDER_SITE_OTHER): Payer: BC Managed Care – PPO | Admitting: Podiatry

## 2016-09-18 ENCOUNTER — Encounter: Payer: Self-pay | Admitting: Podiatry

## 2016-09-18 VITALS — Resp 16 | Ht 65.0 in | Wt 160.0 lb

## 2016-09-18 DIAGNOSIS — L603 Nail dystrophy: Secondary | ICD-10-CM | POA: Diagnosis not present

## 2016-09-18 DIAGNOSIS — B351 Tinea unguium: Secondary | ICD-10-CM

## 2016-09-18 NOTE — Patient Instructions (Signed)

## 2016-09-18 NOTE — Addendum Note (Signed)
Addended by: Marcell Anger on: 09/18/2016 05:14 PM   Modules accepted: Orders

## 2016-09-18 NOTE — Progress Notes (Signed)
Subjective:     Patient ID: RICKESHA VERACRUZ, female   DOB: July 12, 1960, 56 y.o.   MRN: [id number]  HPI 56 year old female presents the office today for concerns of both of her PICC toenails becoming discolored inserting to loosen. She denies any swelling or redness or drainage or pus. She said no recent treatment for it. She denies any recent injury or trauma. She has no other complaints today.  Review of Systems  All other systems reviewed and are negative.      Objective:   Physical Exam General: AAO x3, NAD  Dermatological: Bilateral hallux toenails appear to be dystrophic, discolored with yellow to brown discoloration. There is no some edema, erythema, drainage or pus. The nails are certainly loosened from the underlying nailbed distally but firmly adhered proximally. No open lesions identified today.  Vascular: Dorsalis Pedis artery and Posterior Tibial artery pedal pulses are 2/4 bilateral with immedate capillary fill time. Pedal hair growth present. There is no pain with calf compression, swelling, warmth, erythema.   Neruologic: Grossly intact via light touch bilateral. Vibratory intact via tuning fork bilateral. Protective threshold with Semmes Wienstein monofilament intact to all pedal sites bilateral.   Musculoskeletal: No gross boney pedal deformities bilateral. No pain, crepitus, or limitation noted with foot and ankle range of motion bilateral. Muscular strength 5/5 in all groups tested bilateral.  Gait: Unassisted, Nonantalgic.      Assessment:     56 year old female with onychodystrophy likely onychomycosis    Plan:     -Treatment options discussed including all alternatives, risks, and complications -Etiology of symptoms were discussed -I discussed treatment options for nail fungus. We discussed nail removal, laser therapy, oral and topical medication. Today I debrided both of the hallux toenails this was sent for cultures/pathology. We'll determine definitive  treatment pending culture results. Follow-up after nail culture results are obtained or sooner if needed.  Ovid Curd, DPM

## 2016-09-29 ENCOUNTER — Telehealth: Payer: Self-pay | Admitting: *Deleted

## 2016-09-29 NOTE — Telephone Encounter (Addendum)
-----   Message from Vivi Barrack, DPM sent at 09/28/2016  5:05 PM EDT ----- Negative- can try urea. Please let her know. 09/29/2016-I informed pt of Dr. Gabriel Rung review of results and informed of Revitaderm40 cost and use.

## 2016-09-29 NOTE — Telephone Encounter (Signed)
Pt presents to office states phone message was difficult to understand. I took pt into my office and read the results, informed pt the results suggested microtrauma, which was a consistent injury to the nail and nail bed, like too tight shoes, or toenails hitting the bottom of the shoe, irritating the nailbed, which in cases would cause the toenail to grow thick. I told pt to apply the Revitaderm40 daily and as nail softened gradually begin to file down to thin the nail and may help to be easier to cut. Pt states understanding.

## 2016-10-15 ENCOUNTER — Other Ambulatory Visit: Payer: Self-pay | Admitting: Obstetrics and Gynecology

## 2016-10-15 LAB — HM MAMMOGRAPHY

## 2016-10-15 LAB — HM PAP SMEAR: HM PAP: NORMAL

## 2016-10-16 ENCOUNTER — Encounter: Payer: Self-pay | Admitting: Internal Medicine

## 2016-10-17 LAB — CYTOLOGY - PAP

## 2019-02-07 ENCOUNTER — Other Ambulatory Visit: Payer: Self-pay

## 2019-02-07 ENCOUNTER — Ambulatory Visit: Payer: BC Managed Care – PPO | Admitting: Internal Medicine

## 2019-02-07 ENCOUNTER — Encounter: Payer: Self-pay | Admitting: Internal Medicine

## 2019-02-07 VITALS — BP 114/72 | HR 70 | Temp 98.7°F | Ht 64.5 in | Wt 162.0 lb

## 2019-02-07 DIAGNOSIS — E7800 Pure hypercholesterolemia, unspecified: Secondary | ICD-10-CM

## 2019-02-07 NOTE — Progress Notes (Signed)
HPI  Pt presents to the clinic today to establish care and for management of the conditions listed below. She has not had a PCP in 5 years.  HLD: She has not had her cholesterol checked in many years. She is not taking any cholesterol lowering medication at this time. She tries to consume a low fat diet.  Flu: never Tetanus: 03/2010 Zostavax: never Shingrix: never Pap Smear: 10/2017 Mammogram: 10/2017 Colon Screening: 03/2010 Vision Screening: annually Dentist: annually  Past Medical History:  Diagnosis Date  . History of chicken pox   . Hyperlipidemia     No current outpatient medications on file.   No current facility-administered medications for this visit.     No Known Allergies  Family History  Problem Relation Age of Onset  . Hypertension Mother   . COPD Father   . Cancer Father        pancreatic  . Hypertension Sister     Social History   Socioeconomic History  . Marital status: Married    Spouse name: Not on file  . Number of children: 3  . Years of education: Not on file  . Highest education level: Not on file  Occupational History  . Occupation: Corporate treasurer  Social Needs  . Financial resource strain: Not on file  . Food insecurity    Worry: Not on file    Inability: Not on file  . Transportation needs    Medical: Not on file    Non-medical: Not on file  Tobacco Use  . Smoking status: Never Smoker  . Smokeless tobacco: Never Used  Substance and Sexual Activity  . Alcohol use: No    Alcohol/week: 0.0 standard drinks  . Drug use: No  . Sexual activity: Not on file  Lifestyle  . Physical activity    Days per week: Not on file    Minutes per session: Not on file  . Stress: Not on file  Relationships  . Social Herbalist on phone: Not on file    Gets together: Not on file    Attends religious service: Not on file    Active member of club or organization: Not on file    Attends meetings of clubs or organizations: Not on file     Relationship status: Not on file  . Intimate partner violence    Fear of current or ex partner: Not on file    Emotionally abused: Not on file    Physically abused: Not on file    Forced sexual activity: Not on file  Other Topics Concern  . Not on file  Social History Narrative   Regular exercise-yes, 1 mile waliking a day   Diet: fruits and veggies.Marland Kitchendoes eat a lot of fried foods,water,minimal calcium in diet    ROS:  Constitutional: Denies fever, malaise, fatigue, headache or abrupt weight changes.  HEENT: Denies eye pain, eye redness, ear pain, ringing in the ears, wax buildup, runny nose, nasal congestion, bloody nose, or sore throat. Respiratory: Denies difficulty breathing, shortness of breath, cough or sputum production.   Cardiovascular: Denies chest pain, chest tightness, palpitations or swelling in the hands or feet.  Gastrointestinal: Denies abdominal pain, bloating, constipation, diarrhea or blood in the stool.  GU: Denies frequency, urgency, pain with urination, blood in urine, odor or discharge. Musculoskeletal: Denies decrease in range of motion, difficulty with gait, muscle pain or joint pain and swelling.  Skin: Denies redness, rashes, lesions or ulcercations.  Neurological: Denies dizziness, difficulty  with memory, difficulty with speech or problems with balance and coordination.  Psych: Denies anxiety, depression, SI/HI.  No other specific complaints in a complete review of systems (except as listed in HPI above).  PE: BP 114/72   Pulse 70   Temp 98.7 F (37.1 C) (Temporal)   Ht 5' 4.5" (1.638 m)   Wt 162 lb (73.5 kg)   SpO2 98%   BMI 27.38 kg/m   Wt Readings from Last 3 Encounters:  09/18/16 160 lb (72.6 kg)  04/21/14 162 lb 4 oz (73.6 kg)  03/10/14 160 lb (72.6 kg)    General: Appears her stated age, well developed, well nourished in NAD. Cardiovascular: Normal rate and rhythm. S1,S2 noted.  No murmur, rubs or gallops noted.  Pulmonary/Chest:  Normal effort and positive vesicular breath sounds. No respiratory distress. No wheezes, rales or ronchi noted.  Musculoskeletal:  No difficulty with gait.  Neurological: Alert and oriented.   Psychiatric: Mood and affect normal. Behavior is normal. Judgment and thought content normal.     BMET    Component Value Date/Time   NA 142 01/21/2013 1058   K 4.1 01/21/2013 1058   CL 103 01/21/2013 1058   CO2 30 01/21/2013 1058   GLUCOSE 95 01/21/2013 1058   BUN 18 01/21/2013 1058   CREATININE 0.69 01/21/2013 1058   CALCIUM 10.2 01/21/2013 1058   GFRNONAA >90 01/21/2013 1058   GFRAA >90 01/21/2013 1058    Lipid Panel     Component Value Date/Time   CHOL 209 (H) 03/29/2010 0846   TRIG 39.0 03/29/2010 0846   HDL 67.60 03/29/2010 0846   CHOLHDL 3 03/29/2010 0846   VLDL 7.8 03/29/2010 0846    CBC    Component Value Date/Time   WBC 5.8 01/21/2013 1058   RBC 4.68 01/21/2013 1058   HGB 13.7 01/21/2013 1058   HCT 40.1 01/21/2013 1058   PLT 252 01/21/2013 1058   MCV 85.7 01/21/2013 1058   MCH 29.3 01/21/2013 1058   MCHC 34.2 01/21/2013 1058   RDW 12.8 01/21/2013 1058    Hgb A1C No results found for: HGBA1C   Assessment and Plan:  HLD:  Will check CMET and Lipid profile at annual exam.  Encouraged her to consume a low fat diet.  Return precautions discussed Nicki Reaperegina Heyli Min, NP

## 2019-02-07 NOTE — Patient Instructions (Signed)
Fat and Cholesterol Restricted Eating Plan Getting too much fat and cholesterol in your diet may cause health problems. Choosing the right foods helps keep your fat and cholesterol at normal levels. This can keep you from getting certain diseases. Your doctor may recommend an eating plan that includes:  Total fat: ______% or less of total calories a day.  Saturated fat: ______% or less of total calories a day.  Cholesterol: less than _________mg a day.  Fiber: ______g a day. What are tips for following this plan? Meal planning  At meals, divide your plate into four equal parts: ? Fill one-half of your plate with vegetables and green salads. ? Fill one-fourth of your plate with whole grains. ? Fill one-fourth of your plate with low-fat (lean) protein foods.  Eat fish that is high in omega-3 fats at least two times a week. This includes mackerel, tuna, sardines, and salmon.  Eat foods that are high in fiber, such as whole grains, beans, apples, broccoli, carrots, peas, and barley. General tips   Work with your doctor to lose weight if you need to.  Avoid: ? Foods with added sugar. ? Fried foods. ? Foods with partially hydrogenated oils.  Limit alcohol intake to no more than 1 drink a day for nonpregnant women and 2 drinks a day for men. One drink equals 12 oz of beer, 5 oz of wine, or 1 oz of hard liquor. Reading food labels  Check food labels for: ? Trans fats. ? Partially hydrogenated oils. ? Saturated fat (g) in each serving. ? Cholesterol (mg) in each serving. ? Fiber (g) in each serving.  Choose foods with healthy fats, such as: ? Monounsaturated fats. ? Polyunsaturated fats. ? Omega-3 fats.  Choose grain products that have whole grains. Look for the word "whole" as the first word in the ingredient list. Cooking  Cook foods using low-fat methods. These include baking, boiling, grilling, and broiling.  Eat more home-cooked foods. Eat at restaurants and buffets  less often.  Avoid cooking using saturated fats, such as butter, cream, palm oil, palm kernel oil, and coconut oil. Recommended foods  Fruits  All fresh, canned (in natural juice), or frozen fruits. Vegetables  Fresh or frozen vegetables (raw, steamed, roasted, or grilled). Green salads. Grains  Whole grains, such as whole wheat or whole grain breads, crackers, cereals, and pasta. Unsweetened oatmeal, bulgur, barley, quinoa, or brown rice. Corn or whole wheat flour tortillas. Meats and other protein foods  Ground beef (85% or leaner), grass-fed beef, or beef trimmed of fat. Skinless chicken or turkey. Ground chicken or turkey. Pork trimmed of fat. All fish and seafood. Egg whites. Dried beans, peas, or lentils. Unsalted nuts or seeds. Unsalted canned beans. Nut butters without added sugar or oil. Dairy  Low-fat or nonfat dairy products, such as skim or 1% milk, 2% or reduced-fat cheeses, low-fat and fat-free ricotta or cottage cheese, or plain low-fat and nonfat yogurt. Fats and oils  Tub margarine without trans fats. Light or reduced-fat mayonnaise and salad dressings. Avocado. Olive, canola, sesame, or safflower oils. The items listed above may not be a complete list of foods and beverages you can eat. Contact a dietitian for more information. Foods to avoid Fruits  Canned fruit in heavy syrup. Fruit in cream or butter sauce. Fried fruit. Vegetables  Vegetables cooked in cheese, cream, or butter sauce. Fried vegetables. Grains  White bread. White pasta. White rice. Cornbread. Bagels, pastries, and croissants. Crackers and snack foods that contain trans fat   and hydrogenated oils. Meats and other protein foods  Fatty cuts of meat. Ribs, chicken wings, bacon, sausage, bologna, salami, chitterlings, fatback, hot dogs, bratwurst, and packaged lunch meats. Liver and organ meats. Whole eggs and egg yolks. Chicken and turkey with skin. Fried meat. Dairy  Whole or 2% milk, cream,  half-and-half, and cream cheese. Whole milk cheeses. Whole-fat or sweetened yogurt. Full-fat cheeses. Nondairy creamers and whipped toppings. Processed cheese, cheese spreads, and cheese curds. Beverages  Alcohol. Sugar-sweetened drinks such as sodas, lemonade, and fruit drinks. Fats and oils  Butter, stick margarine, lard, shortening, ghee, or bacon fat. Coconut, palm kernel, and palm oils. Sweets and desserts  Corn syrup, sugars, honey, and molasses. Candy. Jam and jelly. Syrup. Sweetened cereals. Cookies, pies, cakes, donuts, muffins, and ice cream. The items listed above may not be a complete list of foods and beverages you should avoid. Contact a dietitian for more information. Summary  Choosing the right foods helps keep your fat and cholesterol at normal levels. This can keep you from getting certain diseases.  At meals, fill one-half of your plate with vegetables and green salads.  Eat high-fiber foods, like whole grains, beans, apples, carrots, peas, and barley.  Limit added sugar, saturated fats, alcohol, and fried foods. This information is not intended to replace advice given to you by your health care provider. Make sure you discuss any questions you have with your health care provider. Document Released: 11/25/2011 Document Revised: 01/27/2018 Document Reviewed: 02/10/2017 [employer] Patient Education  2020 [employer]  

## 2019-03-10 ENCOUNTER — Ambulatory Visit (INDEPENDENT_AMBULATORY_CARE_PROVIDER_SITE_OTHER): Payer: BC Managed Care – PPO | Admitting: Internal Medicine

## 2019-03-10 ENCOUNTER — Other Ambulatory Visit: Payer: Self-pay

## 2019-03-10 VITALS — BP 122/78 | HR 76 | Temp 97.9°F | Ht 64.5 in | Wt 154.0 lb

## 2019-03-10 DIAGNOSIS — E538 Deficiency of other specified B group vitamins: Secondary | ICD-10-CM

## 2019-03-10 DIAGNOSIS — Z0000 Encounter for general adult medical examination without abnormal findings: Secondary | ICD-10-CM | POA: Diagnosis not present

## 2019-03-10 DIAGNOSIS — Z23 Encounter for immunization: Secondary | ICD-10-CM | POA: Diagnosis not present

## 2019-03-10 NOTE — Progress Notes (Signed)
Subjective:    Patient ID: Cristina Hayes, female    DOB: 1961-04-02, 58 y.o.   MRN: [id number]  HPI  Patient presents to the clinic today for her annual exam.  Flu: never Tetanus: 2011 Shingrix:Never Mammogram: 10/2017, scheduled 03/2019  Pap Smear: 10/2017 Colon Screening: 2011 Bone Density:  Vision Screening: annually Dentist: annually  Diet: Eats meat, fruits and vegetables, and fried stuff. Drinks water through day, and sometimes sweet tea. Exercise: None lately.  Review of Systems      Past Medical History:  Diagnosis Date  . History of chicken pox   . Hyperlipidemia     No current outpatient medications on file.   No current facility-administered medications for this visit.     No Known Allergies  Family History  Problem Relation Age of Onset  . Hypertension Mother   . COPD Father   . Pancreatic cancer Father   . Hypertension Sister     Social History   Socioeconomic History  . Marital status: Married    Spouse name: Not on file  . Number of children: 3  . Years of education: Not on file  . Highest education level: Not on file  Occupational History  . Occupation: Corporate treasurer  Social Needs  . Financial resource strain: Not on file  . Food insecurity    Worry: Not on file    Inability: Not on file  . Transportation needs    Medical: Not on file    Non-medical: Not on file  Tobacco Use  . Smoking status: Never Smoker  . Smokeless tobacco: Never Used  Substance and Sexual Activity  . Alcohol use: No    Alcohol/week: 0.0 standard drinks  . Drug use: No  . Sexual activity: Not on file  Lifestyle  . Physical activity    Days per week: Not on file    Minutes per session: Not on file  . Stress: Not on file  Relationships  . Social Herbalist on phone: Not on file    Gets together: Not on file    Attends religious service: Not on file    Active member of club or organization: Not on file    Attends meetings of clubs  or organizations: Not on file    Relationship status: Not on file  . Intimate partner violence    Fear of current or ex partner: Not on file    Emotionally abused: Not on file    Physically abused: Not on file    Forced sexual activity: Not on file  Other Topics Concern  . Not on file  Social History Narrative   Regular exercise-yes, 1 mile waliking a day   Diet: fruits and veggies.Marland Kitchendoes eat a lot of fried foods,water,minimal calcium in diet     Constitutional: Denies fever, malaise, fatigue, headache or abrupt weight changes.  HEENT: Denies eye pain, eye redness, ear pain, ringing in the ears, wax buildup, runny nose, nasal congestion, bloody nose, or sore throat. Respiratory: Denies difficulty breathing, shortness of breath, cough or sputum production.   Cardiovascular: Denies chest pain, chest tightness, palpitations or swelling in the hands or feet.  Gastrointestinal: Denies abdominal pain, bloating, constipation, diarrhea or blood in the stool.  GU: Denies urgency, frequency, pain with urination, burning sensation, blood in urine, odor or discharge. Musculoskeletal: Denies decrease in range of motion, difficulty with gait, muscle pain or joint pain and swelling.  Skin: Denies redness, rashes, lesions or ulcercations.  Neurological:  Denies dizziness, difficulty with memory, difficulty with speech or problems with balance and coordination.  Psych: Denies anxiety, depression, SI/HI.  No other specific complaints in a complete review of systems (except as listed in HPI above).  Objective:   Physical Exam    BP 122/78   Pulse 76   Temp 97.9 F (36.6 C) (Temporal)   Ht 5' 4.5" (1.638 m)   Wt 154 lb (69.9 kg)   SpO2 98%   BMI 26.03 kg/m   Wt Readings from Last 3 Encounters:  02/07/19 162 lb (73.5 kg)  09/18/16 160 lb (72.6 kg)  04/21/14 162 lb 4 oz (73.6 kg)    General: Appears her stated age, well developed, well nourished in NAD. Skin: Warm, dry and intact. No rashes  noted. HEENT: Head: normal shape and size; Eyes: sclera white, no icterus, conjunctiva pink, PERRLA and EOMs intact; Ears: Tm's gray and intact, normal light reflex; Neck:  Neck supple, trachea midline. No masses, lumps or thyromegaly present.  Cardiovascular: Normal rate and rhythm. S1,S2 noted.  No murmur, rubs or gallops noted. No JVD or BLE edema. No carotid bruits noted. Pulmonary/Chest: Normal effort and positive vesicular breath sounds. No respiratory distress. No wheezes, rales or ronchi noted.  Abdomen: Soft and nontender. Normal bowel sounds. No distention or masses noted. Liver, spleen and kidneys non palpable. Musculoskeletal: Strength 5/5 BUE/BLE. No difficulty with gait.  Neurological: Alert and oriented. Cranial nerves II-XII grossly intact. Coordination normal.  Psychiatric: Mood and affect normal. Behavior is normal. Judgment and thought content normal.    BMET    Component Value Date/Time   NA 142 01/21/2013 1058   K 4.1 01/21/2013 1058   CL 103 01/21/2013 1058   CO2 30 01/21/2013 1058   GLUCOSE 95 01/21/2013 1058   BUN 18 01/21/2013 1058   CREATININE 0.69 01/21/2013 1058   CALCIUM 10.2 01/21/2013 1058   GFRNONAA >90 01/21/2013 1058   GFRAA >90 01/21/2013 1058    Lipid Panel     Component Value Date/Time   CHOL 209 (H) 03/29/2010 0846   TRIG 39.0 03/29/2010 0846   HDL 67.60 03/29/2010 0846   CHOLHDL 3 03/29/2010 0846   VLDL 7.8 03/29/2010 0846    CBC    Component Value Date/Time   WBC 5.8 01/21/2013 1058   RBC 4.68 01/21/2013 1058   HGB 13.7 01/21/2013 1058   HCT 40.1 01/21/2013 1058   PLT 252 01/21/2013 1058   MCV 85.7 01/21/2013 1058   MCH 29.3 01/21/2013 1058   MCHC 34.2 01/21/2013 1058   RDW 12.8 01/21/2013 1058    Hgb A1C No results found for: HGBA1C        Assessment & Plan:   Preventative Health Maintenance:  Flu shot today Tetanus UTD She will consider Shingrix vaccine Pap smear UTD Mammogram scheduled Bone density ordered,  she will schedule with her mammogram Colon screening UTD Encouraged her to consume a balanced diet and exercise regimen Advised her to see an eye doctor and dentist annually We will check CBC, C met, lipid, B12 and vitamin D today  RTC in 1 year, sooner if needed Webb Silversmith, NP

## 2019-03-11 LAB — CBC
HCT: 41.3 % (ref 36.0–46.0)
Hemoglobin: 13.8 g/dL (ref 12.0–15.0)
MCHC: 33.5 g/dL (ref 30.0–36.0)
MCV: 86.9 fl (ref 78.0–100.0)
Platelets: 219 10*3/uL (ref 150.0–400.0)
RBC: 4.75 Mil/uL (ref 3.87–5.11)
RDW: 13.1 % (ref 11.5–15.5)
WBC: 6.6 10*3/uL (ref 4.0–10.5)

## 2019-03-11 LAB — COMPREHENSIVE METABOLIC PANEL
ALT: 12 U/L (ref 0–35)
AST: 18 U/L (ref 0–37)
Albumin: 4.5 g/dL (ref 3.5–5.2)
Alkaline Phosphatase: 93 U/L (ref 39–117)
BUN: 23 mg/dL (ref 6–23)
CO2: 31 mEq/L (ref 19–32)
Calcium: 10.4 mg/dL (ref 8.4–10.5)
Chloride: 101 mEq/L (ref 96–112)
Creatinine, Ser: 0.72 mg/dL (ref 0.40–1.20)
GFR: 82.45 mL/min (ref >60.00)
Glucose, Bld: 84 mg/dL (ref 70–99)
Potassium: 4.9 mEq/L (ref 3.5–5.1)
Sodium: 140 mEq/L (ref 135–145)
Total Bilirubin: 1.9 mg/dL — ABNORMAL HIGH (ref 0.2–1.2)
Total Protein: 7.2 g/dL (ref 6.0–8.3)

## 2019-03-11 LAB — LIPID PANEL
Cholesterol: 184 mg/dL (ref 0–200)
HDL: 68.9 mg/dL (ref >39.00)
LDL Cholesterol: 107 mg/dL — ABNORMAL HIGH (ref 0–99)
NonHDL: 115.35
Total CHOL/HDL Ratio: 3
Triglycerides: 42 mg/dL (ref 0.0–149.0)
VLDL: 8.4 mg/dL (ref 0.0–40.0)

## 2019-03-11 LAB — VITAMIN D 25 HYDROXY (VIT D DEFICIENCY, FRACTURES): VITD: 62.21 ng/mL (ref 30.00–100.00)

## 2019-03-11 LAB — VITAMIN B12: Vitamin B-12: 168 pg/mL — ABNORMAL LOW (ref 211–911)

## 2019-03-12 ENCOUNTER — Encounter: Payer: Self-pay | Admitting: Internal Medicine

## 2019-03-12 NOTE — Patient Instructions (Signed)

## 2019-03-23 ENCOUNTER — Telehealth: Payer: Self-pay | Admitting: Internal Medicine

## 2019-03-23 NOTE — Telephone Encounter (Signed)
Patient returned your call about her lab results.

## 2019-03-25 NOTE — Addendum Note (Signed)
Addended by: Lurlean Nanny on: 03/25/2019 10:16 AM   Modules accepted: Orders

## 2019-06-24 ENCOUNTER — Telehealth: Payer: Self-pay

## 2019-06-24 NOTE — Telephone Encounter (Signed)
LVM to call clinic, pt needs COVID screen, front door and back lab info 1.15.2021 TLJ 

## 2019-06-28 ENCOUNTER — Other Ambulatory Visit: Payer: Self-pay

## 2019-06-28 ENCOUNTER — Other Ambulatory Visit (INDEPENDENT_AMBULATORY_CARE_PROVIDER_SITE_OTHER): Payer: BC Managed Care – PPO

## 2019-06-28 DIAGNOSIS — E538 Deficiency of other specified B group vitamins: Secondary | ICD-10-CM | POA: Diagnosis not present

## 2019-06-28 LAB — VITAMIN B12: Vitamin B-12: 619 pg/mL (ref 211–911)

## 2019-07-02 ENCOUNTER — Encounter: Payer: Self-pay | Admitting: Internal Medicine

## 2019-07-12 ENCOUNTER — Encounter: Payer: Self-pay | Admitting: Internal Medicine

## 2019-07-12 ENCOUNTER — Ambulatory Visit (INDEPENDENT_AMBULATORY_CARE_PROVIDER_SITE_OTHER)
Admission: RE | Admit: 2019-07-12 | Discharge: 2019-07-12 | Disposition: A | Discharge status: Home / Self Care | Payer: BC Managed Care – PPO | Source: Ambulatory Visit | Attending: Internal Medicine | Admitting: Internal Medicine

## 2019-07-12 ENCOUNTER — Other Ambulatory Visit: Payer: Self-pay

## 2019-07-12 ENCOUNTER — Ambulatory Visit: Payer: BC Managed Care – PPO | Admitting: Internal Medicine

## 2019-07-12 VITALS — BP 120/76 | HR 68 | Temp 98.2°F | Wt 164.0 lb

## 2019-07-12 DIAGNOSIS — R202 Paresthesia of skin: Secondary | ICD-10-CM | POA: Diagnosis not present

## 2019-07-12 DIAGNOSIS — M79641 Pain in right hand: Secondary | ICD-10-CM | POA: Diagnosis not present

## 2019-07-12 DIAGNOSIS — M65331 Trigger finger, right middle finger: Secondary | ICD-10-CM

## 2019-07-12 DIAGNOSIS — R7989 Other specified abnormal findings of blood chemistry: Secondary | ICD-10-CM

## 2019-07-12 LAB — FOLATE: Folate: 18.3 ng/mL (ref >5.9)

## 2019-07-12 LAB — TSH: TSH: 5.01 u[IU]/mL — ABNORMAL HIGH (ref 0.35–4.50)

## 2019-07-12 LAB — VITAMIN D 25 HYDROXY (VIT D DEFICIENCY, FRACTURES): VITD: 34.43 ng/mL (ref 30.00–100.00)

## 2019-07-12 MED ORDER — PREDNISONE 10 MG PO TABS: ORAL_TABLET | ORAL | 0 refills | Status: DC

## 2019-07-12 NOTE — Patient Instructions (Signed)
Trigger Finger  Trigger finger, also called stenosing tenosynovitis,  is a condition that causes a finger to get stuck in a bent position. Each finger has a tendon, which is a tough, cord-like tissue that connects muscle to bone, and each tendon passes through a tunnel of tissue called a tendon sheath. To move your finger, your tendon needs to glide freely through the sheath. Trigger finger happens when the tendon or the sheath thickens, making it difficult to move your finger. Trigger finger can affect any finger or a thumb. It may affect more than one finger. Mild cases may clear up with rest and medicine. Severe cases require more treatment. What are the causes? Trigger finger is caused by a thickened finger tendon or tendon sheath. The cause of this thickening is not known. What increases the risk? The following factors may make you more likely to develop this condition:  Doing activities that require a strong grip.  Having rheumatoid arthritis, gout, or diabetes.  Being 40-60 years old.  Being female. What are the signs or symptoms? Symptoms of this condition include:  Pain when bending or straightening your finger.  Tenderness or swelling where your finger attaches to the palm of your hand.  A lump in the palm of your hand or on the inside of your finger.  Hearing a noise like a pop or a snap when you try to straighten your finger.  Feeling a catching or locking sensation when you try to straighten your finger.  Being unable to straighten your finger. How is this diagnosed? This condition is diagnosed based on your symptoms and a physical exam. How is this treated? This condition may be treated by:  Resting your finger and avoiding activities that make symptoms worse.  Wearing a finger splint to keep your finger extended.  Taking NSAIDs, such as ibuprofen, to relieve pain and swelling.  Doing gentle exercises to stretch the finger as told by your health care provider.   Having medicine that reduces swelling and inflammation (steroids) injected into the tendon sheath. Injections may need to be repeated.  Having surgery to open the tendon sheath. This may be done if other treatments do not work and you cannot straighten your finger. You may need physical therapy after surgery. Follow these instructions at home: If you have a splint:  Wear the splint as told by your health care provider. Remove it only as told by your health care provider.  Loosen it if your fingers tingle, become numb, or turn cold and blue.  Keep it clean.  If the splint is not waterproof: ? Do not let it get wet. ? Cover it with a watertight covering when you take a bath or shower. Managing pain, stiffness, and swelling     If directed, apply heat to the affected area as often as told by your health care provider. Use the heat source that your health care provider recommends, such as a moist heat pack or a heating pad.  Place a towel between your skin and the heat source.  Leave the heat on for 20-30 minutes.  Remove the heat if your skin turns bright red. This is especially important if you are unable to feel pain, heat, or cold. You may have a greater risk of getting burned. If directed, put ice on the painful area. To do this:  If you have a removable splint, remove it as told by your health care provider.  Put ice in a plastic bag.  Place a   towel between your skin and the bag or between your splint and the bag.  Leave the ice on for 20 minutes, 2-3 times a day.  Activity  Rest your finger as told by your health care provider. Avoid activities that make the pain worse.  Return to your normal activities as told by your health care provider. Ask your health care provider what activities are safe for you.  Do exercises as told by your health care provider.  Ask your health care provider when it is safe to drive if you have a splint on your hand. General instructions   Take over-the-counter and prescription medicines only as told by your health care provider.  Keep all follow-up visits as told by your health care provider. This is important. Contact a health care provider if:  Your symptoms are not improving with home care. Summary  Trigger finger, also called stenosing tenosynovitis, causes your finger to get stuck in a bent position. This can make it difficult and painful to straighten your finger.  This condition develops when a finger tendon or tendon sheath thickens.  Treatment may include resting your finger, wearing a splint, and taking medicines.  In severe cases, surgery to open the tendon sheath may be needed. This information is not intended to replace advice given to you by your health care provider. Make sure you discuss any questions you have with your health care provider. Document Revised: 10/11/2018 Document Reviewed: 10/11/2018 Elsevier Patient Education  2020 [employer]  

## 2019-07-12 NOTE — Progress Notes (Signed)
Subjective:    Patient ID: Cristina Hayes, female    DOB: 1961-01-26, 59 y.o.   MRN: [id number]  HPI  Pt presents to the clinic today with c/o right hand pain, numbness and tingling. This started 1 month ago. She reports stiffness and numbness/tingling in her right hand in the morning, aching throughout the day of her right palm and fingers 1-4, and sharp pain in same distribution upon movement. She also reports that her right 3rd finger is stiff and painful upon extension. She has tried Aleve before bed with minimal relief. She denies injury to area or overuse of right hand.   Review of Systems      Past Medical History:  Diagnosis Date  . History of chicken pox   . Hyperlipidemia     Current Outpatient Medications  Medication Sig Dispense Refill  . Ascorbic Acid (VITAMIN C) 1000 MG tablet Take 1,000 mg by mouth daily.    . Cholecalciferol (VITAMIN D3) 125 MCG (5000 UT) CAPS Take by mouth.    . Magnesium 200 MG TABS Take by mouth.    . Menaquinone-7 (VITAMIN K2) 100 MCG CAPS Take by mouth.     No current facility-administered medications for this visit.    No Known Allergies  Family History  Problem Relation Age of Onset  . Hypertension Mother   . COPD Father   . Pancreatic cancer Father   . Hypertension Sister     Social History   Socioeconomic History  . Marital status: Married    Spouse name: Not on file  . Number of children: 3  . Years of education: Not on file  . Highest education level: Not on file  Occupational History  . Occupation: Research officer, trade union  Tobacco Use  . Smoking status: Never Smoker  . Smokeless tobacco: Never Used  Substance and Sexual Activity  . Alcohol use: No    Alcohol/week: 0.0 standard drinks  . Drug use: No  . Sexual activity: Not on file  Other Topics Concern  . Not on file  Social History Narrative   Regular exercise-yes, 1 mile waliking a day   Diet: fruits and veggies.Marland Kitchendoes eat a lot of fried foods,water,minimal  calcium in diet   Social Determinants of Health   Financial Resource Strain:   . Difficulty of Paying Living Expenses: Not on file  Food Insecurity:   . Worried About Programme researcher, broadcasting/film/video in the Last Year: Not on file  . Ran Out of Food in the Last Year: Not on file  Transportation Needs:   . Lack of Transportation (Medical): Not on file  . Lack of Transportation (Non-Medical): Not on file  Physical Activity:   . Days of Exercise per Week: Not on file  . Minutes of Exercise per Session: Not on file  Stress:   . Feeling of Stress : Not on file  Social Connections:   . Frequency of Communication with Friends and Family: Not on file  . Frequency of Social Gatherings with Friends and Family: Not on file  . Attends Religious Services: Not on file  . Active Member of Clubs or Organizations: Not on file  . Attends Banker Meetings: Not on file  . Marital Status: Not on file  Intimate Partner Violence:   . Fear of Current or Ex-Partner: Not on file  . Emotionally Abused: Not on file  . Physically Abused: Not on file  . Sexually Abused: Not on file     Constitutional: Denies  fever, malaise, fatigue, headache or abrupt weight changes.  Musculoskeletal: Pt reports decreased extension of right 3rd finger. Denies decrease in range of motion elsewhere, difficulty with gait, muscle pain or joint pain and swelling.  Skin: Denies redness, rashes, lesions or ulcercations.  Neurological: Ptreports paresthesias of right hand. Denies dizziness, difficulty with memory, difficulty with speech or problems with balance and coordination.   No other specific complaints in a complete review of systems (except as listed in HPI above).  Objective:   Physical Exam   BP 120/76   Pulse 68   Temp 98.2 F (36.8 C) (Temporal)   Wt 164 lb (74.4 kg)   SpO2 98%   BMI 27.72 kg/m   Wt Readings from Last 3 Encounters:  03/10/19 154 lb (69.9 kg)  02/07/19 162 lb (73.5 kg)  09/18/16 160 lb  (72.6 kg)    General: Appears her stated age, well developed, well nourished in NAD. Skin: Warm, dry and intact. No rashes, lesions or ulcerations noted. Cardiovascular: Normal rate and rhythm. S1,S2 noted.  No murmur, rubs or gallops noted. Cap refill < 3 secs, right hand. Radial pulse 2+ on the right. Pulmonary/Chest: Normal effort and positive vesicular breath sounds. No respiratory distress. No wheezes, rales or ronchi noted.  Musculoskeletal: Decreased extension of right 3rd finger. Fullness of 3rd flexor tendon and painful to palpation. Normal flexion, extension and rotation of the right wrist. No signs of joint swelling. Hand grips equal. Neurological: Positive Tinel test. Negative Phalen and Finkelstein tests. Alert and oriented.  Coordination normal.     BMET    Component Value Date/Time   NA 140 03/10/2019 1554   K 4.9 03/10/2019 1554   CL 101 03/10/2019 1554   CO2 31 03/10/2019 1554   GLUCOSE 84 03/10/2019 1554   BUN 23 03/10/2019 1554   CREATININE 0.72 03/10/2019 1554   CALCIUM 10.4 03/10/2019 1554   GFRNONAA >90 01/21/2013 1058   GFRAA >90 01/21/2013 1058    Lipid Panel     Component Value Date/Time   CHOL 184 03/10/2019 1554   TRIG 42.0 03/10/2019 1554   HDL 68.90 03/10/2019 1554   CHOLHDL 3 03/10/2019 1554   VLDL 8.4 03/10/2019 1554   LDLCALC 107 (H) 03/10/2019 1554    CBC    Component Value Date/Time   WBC 6.6 03/10/2019 1554   RBC 4.75 03/10/2019 1554   HGB 13.8 03/10/2019 1554   HCT 41.3 03/10/2019 1554   PLT 219.0 03/10/2019 1554   MCV 86.9 03/10/2019 1554   MCH 29.3 01/21/2013 1058   MCHC 33.5 03/10/2019 1554   RDW 13.1 03/10/2019 1554    Hgb A1C No results found for: HGBA1C        Assessment & Plan:   Pain and Paresthesias of Right Hand:    Rx sent for Prednisone Taper x 9 days Will checkvitamin D, folate, and TSH today X-ray right hand ordered Encouraged cock up splint if labs normal, as this could be carpal tunnel  Trigger  Finger of Right Third Finger:  Make an appt with Dr. Lorelei Pont to see if he feels like you need a steroid injection   Return precautions discussed   Webb Silversmith, NP This visit occurred during the SARS-CoV-2 public health emergency.  Safety protocols were in place, including screening questions prior to the visit, additional usage of staff PPE, and extensive cleaning of exam room while observing appropriate contact time as indicated for disinfecting solutions.

## 2019-07-13 NOTE — Addendum Note (Signed)
Addended by: Lorre Munroe on: 07/13/2019 01:25 PM   Modules accepted: Orders

## 2019-07-18 ENCOUNTER — Ambulatory Visit: Payer: BC Managed Care – PPO | Admitting: Family Medicine

## 2019-08-10 ENCOUNTER — Other Ambulatory Visit (INDEPENDENT_AMBULATORY_CARE_PROVIDER_SITE_OTHER): Payer: BC Managed Care – PPO

## 2019-08-10 DIAGNOSIS — R7989 Other specified abnormal findings of blood chemistry: Secondary | ICD-10-CM | POA: Diagnosis not present

## 2019-08-10 LAB — T4, FREE: Free T4: 0.79 ng/dL (ref 0.60–1.60)

## 2019-08-10 LAB — T3: T3, Total: 143 ng/dL (ref 76–181)

## 2019-08-10 LAB — TSH: TSH: 4.37 u[IU]/mL (ref 0.35–4.50)

## 2020-04-10 ENCOUNTER — Encounter: Payer: Self-pay | Admitting: Internal Medicine

## 2020-06-19 ENCOUNTER — Encounter: Payer: Self-pay | Admitting: Internal Medicine

## 2020-06-19 ENCOUNTER — Ambulatory Visit (INDEPENDENT_AMBULATORY_CARE_PROVIDER_SITE_OTHER): Payer: BC Managed Care – PPO | Admitting: Internal Medicine

## 2020-06-19 ENCOUNTER — Other Ambulatory Visit: Payer: Self-pay

## 2020-06-19 VITALS — BP 118/80 | HR 72 | Temp 97.8°F | Ht 64.5 in | Wt 158.0 lb

## 2020-06-19 DIAGNOSIS — Z114 Encounter for screening for human immunodeficiency virus [HIV]: Secondary | ICD-10-CM | POA: Diagnosis not present

## 2020-06-19 DIAGNOSIS — Z23 Encounter for immunization: Secondary | ICD-10-CM

## 2020-06-19 DIAGNOSIS — L84 Corns and callosities: Secondary | ICD-10-CM

## 2020-06-19 DIAGNOSIS — Z1159 Encounter for screening for other viral diseases: Secondary | ICD-10-CM

## 2020-06-19 DIAGNOSIS — Z1211 Encounter for screening for malignant neoplasm of colon: Secondary | ICD-10-CM | POA: Diagnosis not present

## 2020-06-19 DIAGNOSIS — Z0001 Encounter for general adult medical examination with abnormal findings: Secondary | ICD-10-CM

## 2020-06-19 DIAGNOSIS — E7800 Pure hypercholesterolemia, unspecified: Secondary | ICD-10-CM

## 2020-06-19 LAB — LIPID PANEL
Cholesterol: 225 mg/dL — ABNORMAL HIGH (ref 0–200)
HDL: 72.6 mg/dL (ref >39.00)
LDL Cholesterol: 139 mg/dL — ABNORMAL HIGH (ref 0–99)
NonHDL: 152.77
Total CHOL/HDL Ratio: 3
Triglycerides: 70 mg/dL (ref 0.0–149.0)
VLDL: 14 mg/dL (ref 0.0–40.0)

## 2020-06-19 LAB — COMPREHENSIVE METABOLIC PANEL
ALT: 16 U/L (ref 0–35)
AST: 18 U/L (ref 0–37)
Albumin: 4.4 g/dL (ref 3.5–5.2)
Alkaline Phosphatase: 105 U/L (ref 39–117)
BUN: 20 mg/dL (ref 6–23)
CO2: 34 mEq/L — ABNORMAL HIGH (ref 19–32)
Calcium: 10.3 mg/dL (ref 8.4–10.5)
Chloride: 103 mEq/L (ref 96–112)
Creatinine, Ser: 0.71 mg/dL (ref 0.40–1.20)
GFR: 91.45 mL/min (ref >60.00)
Glucose, Bld: 98 mg/dL (ref 70–99)
Potassium: 4.5 mEq/L (ref 3.5–5.1)
Sodium: 141 mEq/L (ref 135–145)
Total Bilirubin: 1.9 mg/dL — ABNORMAL HIGH (ref 0.2–1.2)
Total Protein: 7.4 g/dL (ref 6.0–8.3)

## 2020-06-19 LAB — CBC
HCT: 40.4 % (ref 36.0–46.0)
Hemoglobin: 13.5 g/dL (ref 12.0–15.0)
MCHC: 33.5 g/dL (ref 30.0–36.0)
MCV: 85.5 fl (ref 78.0–100.0)
Platelets: 290 10*3/uL (ref 150.0–400.0)
RBC: 4.73 Mil/uL (ref 3.87–5.11)
RDW: 13.3 % (ref 11.5–15.5)
WBC: 6 10*3/uL (ref 4.0–10.5)

## 2020-06-19 NOTE — Assessment & Plan Note (Signed)
C-Met and lipid profile today Encouraged her to consume a low-fat diet 

## 2020-06-19 NOTE — Patient Instructions (Signed)

## 2020-06-19 NOTE — Addendum Note (Signed)
Addended by: Roena Malady on: 06/19/2020 12:11 PM   Modules accepted: Orders

## 2020-06-19 NOTE — Progress Notes (Signed)
Subjective:    Patient ID: Cristina Hayes, female    DOB: 10-22-1960, 60 y.o.   MRN: [id number]  HPI  Pt presents to the clinic today for her annual exam.  HLD: Her last LDL was 107, 03/2019. She is not taking any cholesterol lowering medication OTC. She tries to consume a low fat diet.  Flu: 03/2019 Tetanus: 03/2010 Covid: Pfizer x 2 Shingrix: never Pap Smear: 10/2018 Mammogram: 04/2020 Colon Screening: 03/2010 Vision Screening: annually Dentist: annually  Diet: She does eat meat. She consumes fruits and veggies daily. She does eat some fried foods. She drinks mostly water. Exercise: None   Review of Systems      Past Medical History:  Diagnosis Date  . History of chicken pox   . Hyperlipidemia     Current Outpatient Medications  Medication Sig Dispense Refill  . Ascorbic Acid (VITAMIN C) 1000 MG tablet Take 1,000 mg by mouth daily.    . Cholecalciferol (VITAMIN D3) 125 MCG (5000 UT) CAPS Take by mouth.    . Menaquinone-7 (VITAMIN K2) 100 MCG CAPS Take by mouth.    . predniSONE (DELTASONE) 10 MG tablet Take 3 tabs on days 1-3, take 2 tabs on days 4-6, take 1 tab on days 7-9 18 tablet 0  . Quercetin 250 MG TABS Take 500 mg by mouth.    . vitamin B-12 (CYANOCOBALAMIN) 1000 MCG tablet Take 1,000 mcg by mouth daily.    . Zinc 50 MG CAPS Take by mouth.     No current facility-administered medications for this visit.    No Known Allergies  Family History  Problem Relation Age of Onset  . Hypertension Mother   . COPD Father   . Pancreatic cancer Father   . Hypertension Sister     Social History   Socioeconomic History  . Marital status: Married    Spouse name: Not on file  . Number of children: 3  . Years of education: Not on file  . Highest education level: Not on file  Occupational History  . Occupation: Corporate treasurer  Tobacco Use  . Smoking status: Never Smoker  . Smokeless tobacco: Never Used  Substance and Sexual Activity  . Alcohol use:  No    Alcohol/week: 0.0 standard drinks  . Drug use: No  . Sexual activity: Not on file  Other Topics Concern  . Not on file  Social History Narrative   Regular exercise-yes, 1 mile waliking a day   Diet: fruits and veggies.Marland Kitchendoes eat a lot of fried foods,water,minimal calcium in diet   Social Determinants of Health   Financial Resource Strain: Not on file  Food Insecurity: Not on file  Transportation Needs: Not on file  Physical Activity: Not on file  Stress: Not on file  Social Connections: Not on file  Intimate Partner Violence: Not on file     Constitutional: Denies fever, malaise, fatigue, headache or abrupt weight changes.  HEENT: Denies eye pain, eye redness, ear pain, ringing in the ears, wax buildup, runny nose, nasal congestion, bloody nose, or sore throat. Respiratory: Denies difficulty breathing, shortness of breath, cough or sputum production.   Cardiovascular: Denies chest pain, chest tightness, palpitations or swelling in the hands or feet.  Gastrointestinal: Denies abdominal pain, bloating, constipation, diarrhea or blood in the stool.  GU: Denies urgency, frequency, pain with urination, burning sensation, blood in urine, odor or discharge. Musculoskeletal: Denies decrease in range of motion, difficulty with gait, muscle pain or joint pain and swelling.  Skin: Pt reports skin lesion of toe. Denies redness, rashes, or ulcercations.  Neurological: Denies dizziness, difficulty with memory, difficulty with speech or problems with balance and coordination.  Psych: Denies anxiety, depression, SI/HI.  No other specific complaints in a complete review of systems (except as listed in HPI above).  Objective:   Physical Exam  BP 118/80   Pulse 72   Temp 97.8 F (36.6 C) (Temporal)   Ht 5' 4.5" (1.638 m)   Wt 158 lb (71.7 kg)   SpO2 97%   BMI 26.70 kg/m   Wt Readings from Last 3 Encounters:  07/12/19 164 lb (74.4 kg)  03/10/19 154 lb (69.9 kg)  02/07/19 162 lb  (73.5 kg)    General: Appears her stated age, well developed, well nourished in NAD. Skin: Warm, dry and intact. Raised callous/blister to lateral edge 3rd toe right foot. HEENT: Head: normal shape and size; Eyes: sclera white, no icterus, conjunctiva pink, PERRLA and EOMs intact;  Neck:  Neck supple, trachea midline. No masses, lumps or thyromegaly present.  Cardiovascular: Normal rate and rhythm. S1,S2 noted.  No murmur, rubs or gallops noted. No JVD or BLE edema. No carotid bruits noted. Pulmonary/Chest: Normal effort and positive vesicular breath sounds. No respiratory distress. No wheezes, rales or ronchi noted.  Abdomen: Soft and nontender. Normal bowel sounds. No distention or masses noted. Liver, spleen and kidneys non palpable. Musculoskeletal: Strength 5/5 BUE/BLE. No difficulty with gait.  Neurological: Alert and oriented. Cranial nerves II-XII grossly intact. Coordination normal.  Psychiatric: Mood and affect normal. Behavior is normal. Judgment and thought content normal.    BMET    Component Value Date/Time   NA 140 03/10/2019 1554   K 4.9 03/10/2019 1554   CL 101 03/10/2019 1554   CO2 31 03/10/2019 1554   GLUCOSE 84 03/10/2019 1554   BUN 23 03/10/2019 1554   CREATININE 0.72 03/10/2019 1554   CALCIUM 10.4 03/10/2019 1554   GFRNONAA >90 01/21/2013 1058   GFRAA >90 01/21/2013 1058    Lipid Panel     Component Value Date/Time   CHOL 184 03/10/2019 1554   TRIG 42.0 03/10/2019 1554   HDL 68.90 03/10/2019 1554   CHOLHDL 3 03/10/2019 1554   VLDL 8.4 03/10/2019 1554   LDLCALC 107 (H) 03/10/2019 1554    CBC    Component Value Date/Time   WBC 6.6 03/10/2019 1554   RBC 4.75 03/10/2019 1554   HGB 13.8 03/10/2019 1554   HCT 41.3 03/10/2019 1554   PLT 219.0 03/10/2019 1554   MCV 86.9 03/10/2019 1554   MCH 29.3 01/21/2013 1058   MCHC 33.5 03/10/2019 1554   RDW 13.1 03/10/2019 1554    Hgb A1C No results found for: HGBA1C         Assessment & Plan:     Preventative Health Maintenance:  Flu shot today Tetanus today She plans to get her third COVID booster but will need to wait 90 days after COVID on December 14 She will consider Shingrix vaccine Will request most recent Pap smear from [facility] Referral to GI for screening colonoscopy Encouraged her to consume a balanced diet and exercise regimen Advised her to see an eye doctor and dentist annually We will check CBC, c-Met, lipid, HIV and hep C today  Callus of Toe:  Referral to podiatry for further evaluation and treatment  RTC in 1 year, sooner if needed   Webb Silversmith, NP This visit occurred during the SARS-CoV-2 public health emergency.  Safety protocols were in place, including screening questions prior to the visit, additional usage of staff PPE, and extensive cleaning of exam room while observing appropriate contact time as indicated for disinfecting solutions.

## 2020-06-20 LAB — HIV ANTIBODY (ROUTINE TESTING W REFLEX): HIV 1&2 Ab, 4th Generation: NONREACTIVE

## 2020-06-20 LAB — HEPATITIS C ANTIBODY
Hepatitis C Ab: NONREACTIVE
SIGNAL TO CUT-OFF: 0.02 (ref <1.00)

## 2020-06-26 DIAGNOSIS — N952 Postmenopausal atrophic vaginitis: Secondary | ICD-10-CM | POA: Insufficient documentation

## 2020-06-27 ENCOUNTER — Other Ambulatory Visit: Payer: Self-pay

## 2020-06-27 ENCOUNTER — Encounter: Payer: Self-pay | Admitting: Podiatry

## 2020-06-27 ENCOUNTER — Ambulatory Visit: Payer: BC Managed Care – PPO | Admitting: Podiatry

## 2020-06-27 DIAGNOSIS — M6740 Ganglion, unspecified site: Secondary | ICD-10-CM

## 2020-06-27 MED ORDER — MUPIROCIN 2 % EX OINT: 1.0000 "application " | TOPICAL_OINTMENT | Freq: Two times a day (BID) | CUTANEOUS | 2 refills | Status: DC

## 2020-06-27 NOTE — Progress Notes (Signed)
  Subjective:  Patient ID: Cristina Hayes, female    DOB: 09/26/1960,  MRN: [id number]  Chief Complaint  Patient presents with  . Callouses    Patient presents today for blister/corn on right 3rd toe x 6 months.  She says it comes and goes and only hurts when she hits it.    60 y.o. female presents with the above complaint. History confirmed with patient.   Objective:  Physical Exam: warm, good capillary refill, no trophic changes or ulcerative lesions, normal DP and PT pulses and normal sensory exam.   Right Foot: mucoid cyst lateral DIPJ 3rd toe    Assessment:   1. Mucoid cyst of joint      Plan:  Patient was evaluated and treated and all questions answered.  Discussed with her the etiology and treatment options for digital mucoid cyst.  Recommended drainage and decompression of the cyst today.  Following sterile prep of Betadine and digital block with 0.5 cc each of 1% Xylocaine plain and 0.5% bupivacaine plain, the mucoid cyst was lanced and the risk and a sterile dressing was Iodosorb ointment was applied with gauze and Coban.  We will allow the cyst to heal in secondarily with dressing.  She will change the prescription tomorrow and apply mupirocin discussed that this can be to be recurrent issue that surgical excision and wound arthroplasty arthrodesis of the IP joint could be beneficial.  We will try to avoid this if at all possible.  Return in about 2 weeks (around 07/11/2020).

## 2020-06-27 NOTE — Patient Instructions (Signed)
Apply the ointment daily with a bandage for 1 week and then leave open to air

## 2020-07-04 ENCOUNTER — Encounter: Payer: Self-pay | Admitting: Gastroenterology

## 2020-07-11 ENCOUNTER — Other Ambulatory Visit: Payer: Self-pay

## 2020-07-11 ENCOUNTER — Encounter: Payer: Self-pay | Admitting: Podiatry

## 2020-07-11 ENCOUNTER — Ambulatory Visit: Payer: BC Managed Care – PPO | Admitting: Podiatry

## 2020-07-11 DIAGNOSIS — M6740 Ganglion, unspecified site: Secondary | ICD-10-CM

## 2020-07-11 NOTE — Progress Notes (Signed)
  Subjective:  Patient ID: Cristina Hayes, female    DOB: May 11, 1961,  MRN: [id number]  Chief Complaint  Patient presents with  . Foot Ulcer    'its doing better"    60 y.o. female returns for follow-up about the mucoid cyst that we debrided. History confirmed with patient.   Objective:  Physical Exam: warm, good capillary refill, no trophic changes or ulcerative lesions, normal DP and PT pulses and normal sensory exam.   Right Foot: No recurrent cyst currently, well-healed scab from debridement   Assessment:   1. Mucoid cyst of joint      Plan:  Patient was evaluated and treated and all questions answered.  Is healing well.  Currently not bothersome for her.  We discussed further treatment including arthroplasty and excision of the cyst in the operating room.  If it continues to recur bothers her again we will consider this.  Return as needed for this  Return if symptoms worsen or fail to improve.

## 2020-08-21 ENCOUNTER — Ambulatory Visit (AMBULATORY_SURGERY_CENTER): Payer: Self-pay | Admitting: *Deleted

## 2020-08-21 ENCOUNTER — Other Ambulatory Visit: Payer: Self-pay

## 2020-08-21 VITALS — Ht 64.5 in | Wt 162.6 lb

## 2020-08-21 DIAGNOSIS — Z1211 Encounter for screening for malignant neoplasm of colon: Secondary | ICD-10-CM

## 2020-08-21 MED ORDER — SUTAB 1479-225-188 MG PO TABS: 1.0000 | ORAL_TABLET | Freq: Once | ORAL | 0 refills | Status: AC

## 2020-08-21 NOTE — Progress Notes (Signed)
Pt has PONV, denies being told that she is difficult to intubate, or fam hx/hx of malignant hyperthermia   No egg or soy allergy  No home oxygen use   No medications for weight loss taken  emmi information given  Pt denies constipation issues  Pt informed that we do not do prior authorizations for prep   Sutab code put into RX and paper copy given to pt to show pharmacy

## 2020-08-22 ENCOUNTER — Encounter: Payer: Self-pay | Admitting: Gastroenterology

## 2020-08-23 ENCOUNTER — Encounter: Payer: Self-pay | Admitting: Gastroenterology

## 2020-08-23 ENCOUNTER — Telehealth: Payer: Self-pay | Admitting: Gastroenterology

## 2020-08-23 NOTE — Telephone Encounter (Signed)
Spoke with the patient. Patient states her parents and 2 sisters had colon polyps. This was added in her family hx in Epic. Pt aware.

## 2020-08-23 NOTE — Telephone Encounter (Signed)
Patient called and stated she was to call back with her family history of polyps.

## 2020-09-04 ENCOUNTER — Other Ambulatory Visit: Payer: Self-pay

## 2020-09-04 ENCOUNTER — Encounter: Payer: Self-pay | Admitting: Gastroenterology

## 2020-09-04 ENCOUNTER — Ambulatory Visit (AMBULATORY_SURGERY_CENTER): Payer: BC Managed Care – PPO | Admitting: Gastroenterology

## 2020-09-04 VITALS — BP 109/71 | HR 75 | Temp 97.9°F | Resp 14 | Ht 64.5 in | Wt 162.6 lb

## 2020-09-04 DIAGNOSIS — Z1211 Encounter for screening for malignant neoplasm of colon: Secondary | ICD-10-CM

## 2020-09-04 MED ORDER — SODIUM CHLORIDE 0.9 % IV SOLN: 500.0000 mL | INTRAVENOUS | Status: DC

## 2020-09-04 NOTE — Patient Instructions (Signed)
Handout given: diverticulosis Resume previous diet Repeat colonoscopy in 10 years !!!! YOU HAD AN ENDOSCOPIC PROCEDURE TODAY AT THE [facility]:   Refer to the procedure report that was given to you for any specific questions about what was found during the examination.  If the procedure report does not answer your questions, please call your gastroenterologist to clarify.  If you requested that your care partner not be given the details of your procedure findings, then the procedure report has been included in a sealed envelope for you to review at your convenience later.  YOU SHOULD EXPECT: Some feelings of bloating in the abdomen. Passage of more gas than usual.  Walking can help get rid of the air that was put into your GI tract during the procedure and reduce the bloating. If you had a lower endoscopy (such as a colonoscopy or flexible sigmoidoscopy) you may notice spotting of blood in your stool or on the toilet paper. If you underwent a bowel prep for your procedure, you may not have a normal bowel movement for a few days.  Please Note:  You might notice some irritation and congestion in your nose or some drainage.  This is from the oxygen used during your procedure.  There is no need for concern and it should clear up in a day or so.  SYMPTOMS TO REPORT IMMEDIATELY:   Following lower endoscopy (colonoscopy or flexible sigmoidoscopy):  Excessive amounts of blood in the stool  Significant tenderness or worsening of abdominal pains  Swelling of the abdomen that is new, acute  Fever of 100F or higher  For urgent or emergent issues, a gastroenterologist can be reached at any hour by calling [phone]. Do not use MyChart messaging for urgent concerns.   DIET:  We do recommend a small meal at first, but then you may proceed to your regular diet.  Drink plenty of fluids but you should avoid alcoholic beverages for 24 hours.  ACTIVITY:  You should plan to take it easy for the  rest of today and you should NOT DRIVE or use heavy machinery until tomorrow (because of the sedation medicines used during the test).    FOLLOW UP: Our staff will call the number listed on your records 48-72 hours following your procedure to check on you and address any questions or concerns that you may have regarding the information given to you following your procedure. If we do not reach you, we will leave a message.  We will attempt to reach you two times.  During this call, we will ask if you have developed any symptoms of COVID 19. If you develop any symptoms (ie: fever, flu-like symptoms, shortness of breath, cough etc.) before then, please call [phone].  If you test positive for Covid 19 in the 2 weeks post procedure, please call and report this information to [place].    If any biopsies were taken you will be contacted by phone or by letter within the next 1-3 weeks.  Please call us at [phone] if you have not heard about the biopsies in 3 weeks.   SIGNATURES/CONFIDENTIALITY: You and/or your care partner have signed paperwork which will be entered into your electronic medical record.  These signatures attest to the fact that that the information above on your After Visit Summary has been reviewed and is understood.  Full responsibility of the confidentiality of this discharge information lies with you and/or your care-partner.

## 2020-09-04 NOTE — Progress Notes (Signed)
VS by CW.  previsity with KW.  No changes to health hx since previsit

## 2020-09-04 NOTE — Progress Notes (Signed)
PT taken to PACU. Monitors in place. VSS. Report given to RN. 

## 2020-09-04 NOTE — Op Note (Signed)
[facility] Patient Name: Cristina Hayes Procedure Date: 09/04/2020 9:28 AM MRN: [id number] Endoscopist: Napoleon Form , MD Age: 60 Referring MD:  Date of Birth: 11/08/1960 Gender: Female Account #: [id number] Procedure:                Colonoscopy Indications:              Screening for colorectal malignant neoplasm Medicines:                Monitored Anesthesia Care Procedure:                Pre-Anesthesia Assessment:                           - Prior to the procedure, a History and Physical                            was performed, and patient medications and                            allergies were reviewed. The patient's tolerance of                            previous anesthesia was also reviewed. The risks                            and benefits of the procedure and the sedation                            options and risks were discussed with the patient.                            All questions were answered, and informed consent                            was obtained. Prior Anticoagulants: The patient has                            taken no previous anticoagulant or antiplatelet                            agents. ASA Grade Assessment: II - A patient with                            mild systemic disease. After reviewing the risks                            and benefits, the patient was deemed in                            satisfactory condition to undergo the procedure.                           After obtaining informed consent, the colonoscope  was passed under direct vision. Throughout the                            procedure, the patient's blood pressure, pulse, and                            oxygen saturations were monitored continuously. The                            Olympus PCF-H190DL ([id number]) Colonoscope was                            introduced through the anus and advanced to the the                            cecum,  identified by appendiceal orifice and                            ileocecal valve. The colonoscopy was performed                            without difficulty. The patient tolerated the                            procedure well. The quality of the bowel                            preparation was excellent. The ileocecal valve,                            appendiceal orifice, and rectum were photographed. Scope In: 9:44:12 AM Scope Out: 10:00:13 AM Scope Withdrawal Time: 0 hours 9 minutes 9 seconds  Total Procedure Duration: 0 hours 16 minutes 1 second  Findings:                 The perianal and digital rectal examinations were                            normal.                           A few small and large-mouthed diverticula were                            found in the sigmoid colon.                           Non-bleeding internal hemorrhoids were found during                            retroflexion. The hemorrhoids were small.                           The exam was otherwise without abnormality. Complications:            No immediate complications. Estimated Blood  Loss:     Estimated blood loss was minimal. Impression:               - Diverticulosis in the sigmoid colon.                           - Non-bleeding internal hemorrhoids.                           - The examination was otherwise normal.                           - No specimens collected. Recommendation:           - Patient has a contact number available for                            emergencies. The signs and symptoms of potential                            delayed complications were discussed with the                            patient. Return to normal activities tomorrow.                            Written discharge instructions were provided to the                            patient.                           - Resume previous diet.                           - Continue present medications.                           -  Repeat colonoscopy in 10 years for screening                            purposes. Napoleon Form, MD 09/04/2020 10:05:18 AM This report has been signed electronically.

## 2020-09-06 ENCOUNTER — Telehealth: Payer: Self-pay

## 2020-09-06 NOTE — Telephone Encounter (Signed)
Left meaasge on answering machine.

## 2020-11-15 ENCOUNTER — Ambulatory Visit
Admission: EM | Admit: 2020-11-15 | Discharge: 2020-11-15 | Disposition: A | Discharge status: Home / Self Care | Payer: BC Managed Care – PPO | Attending: Internal Medicine | Admitting: Internal Medicine

## 2020-11-15 ENCOUNTER — Other Ambulatory Visit: Payer: Self-pay

## 2020-11-15 ENCOUNTER — Encounter: Payer: Self-pay | Admitting: Emergency Medicine

## 2020-11-15 DIAGNOSIS — S6990XA Unspecified injury of unspecified wrist, hand and finger(s), initial encounter: Secondary | ICD-10-CM

## 2020-11-15 NOTE — Discharge Instructions (Addendum)
Please soak your finger in warm salt water 3 times a day until the wound heals If you notice any redness, swelling, worsening pain or discharge please return to the urgent care to be evaluated.

## 2020-11-15 NOTE — ED Triage Notes (Signed)
Pt reports fishing hook on her left 4th digit onset 1430  No bleeding at the moment.   Last tetanus 06/19/2020  A&O x4... NAD.Marland Kitchen. ambulatory

## 2020-11-15 NOTE — ED Provider Notes (Signed)
[facility]    CSN: [id number] Arrival date & time: 11/15/20  1518      History   Chief Complaint Chief Complaint  Patient presents with   Foreign Body    HPI Cristina Hayes is a 60 y.o. female comes to the urgent care with fishhook injury to the left ring finger.  Patient was fishing with her grandson when that happened.  No bleeding.  The fishhook is embedded in the pulp of the distal phalanx.  No erythema.      HPI  Past Medical History:  Diagnosis Date   History of chicken pox    Hyperlipidemia    no per pt    Patient Active Problem List   Diagnosis Date Noted   Atrophic vaginitis 06/26/2020   HLD (hyperlipidemia) 03/26/2010    Past Surgical History:  Procedure Laterality Date   ABLATION     COLONOSCOPY     OVARIAN CYST REMOVAL     TUBAL LIGATION      OB History   No obstetric history on file.      Home Medications    Prior to Admission medications   Not on File    Family History Family History  Problem Relation Age of Onset   Hypertension Mother    Colon polyps Mother    COPD Father    Pancreatic cancer Father    Colon polyps Father    Hypertension Sister    Colon polyps Sister    Colon polyps Sister    Colon cancer Neg Hx    Esophageal cancer Neg Hx    Rectal cancer Neg Hx    Stomach cancer Neg Hx     Social History Social History   Tobacco Use   Smoking status: Never   Smokeless tobacco: Never  Vaping Use   Vaping Use: Never used  Substance Use Topics   Alcohol use: No    Alcohol/week: 0.0 standard drinks   Drug use: No     Allergies   Patient has no known allergies.   Review of Systems Review of Systems  Genitourinary: Negative.   Musculoskeletal: Negative.   Skin:  Positive for wound. Negative for color change, pallor and rash.  Neurological: Negative.     Physical Exam Triage Vital Signs ED Triage Vitals  Enc Vitals Group     BP 11/15/20 1558 (!) 148/78     Pulse Rate 11/15/20 1558 75      Resp 11/15/20 1558 18     Temp 11/15/20 1558 98.1 F (36.7 C)     Temp Source 11/15/20 1558 Oral     SpO2 11/15/20 1558 98 %     Weight --      Height --      Head Circumference --      Peak Flow --      Pain Score 11/15/20 1557 1     Pain Loc --      Pain Edu? --      Excl. in GC? --    No data found.  Updated Vital Signs BP (!) 148/78 (BP Location: Right Arm)   Pulse 75   Temp 98.1 F (36.7 C) (Oral)   Resp 18   SpO2 98%   Visual Acuity Right Eye Distance:   Left Eye Distance:   Bilateral Distance:    Right Eye Near:   Left Eye Near:    Bilateral Near:     Physical Exam Vitals and nursing note reviewed.  Skin:    Comments: Fishhook is embedded in the distal phalanx of the left fourth finger.  Bleeding is controlled.  The barb is embedded in the skin.     UC Treatments / Results  Labs (all labs ordered are listed, but only abnormal results are displayed) Labs Reviewed - No data to display  EKG   Radiology No results found.  Procedures Foreign Body Removal  Date/Time: 11/15/2020 5:04 PM Performed by: Merrilee Jansky, MD Authorized by: Merrilee Jansky, MD   Consent:    Consent obtained:  Verbal   Consent given by:  Patient   Risks discussed:  Bleeding and infection Universal protocol:    Patient identity confirmed:  Verbally with patient Location:    Location:  Finger   Finger location:  L ring finger   Depth:  Subcutaneous   Tendon involvement:  None Pre-procedure details:    Imaging:  None Procedure details:    Localization method:  Visualized   Dissection of underlying tissues: no     Bloodless field: no     Removal mechanism:  Hemostat   Foreign bodies recovered:  1   Description:  Fishhook   Intact foreign body removal: yes   Post-procedure details:    Neurovascular status: intact     Confirmation:  No additional foreign bodies on visualization   Skin closure:  None   Dressing:  Antibiotic ointment   Procedure completion:   Tolerated well, no immediate complications (including critical care time)  Medications Ordered in UC Medications - No data to display  Initial Impression / Assessment and Plan / UC Course  I have reviewed the triage vital signs and the nursing notes.  Pertinent labs & imaging results that were available during my care of the patient were reviewed by me and considered in my medical decision making (see chart for details).     1.  Fishhook injury to the left finger: Fishhook has been removed Warm salt water soaks 3-4 times a day If there are any signs of infection patient is advised to return to the urgent care No antibiotics indicated at this time Patient's Tdap is updated. Final Clinical Impressions(s) / UC Diagnoses   Final diagnoses:  Fish hook injury of finger, unspecified laterality, initial encounter     Discharge Instructions      Please soak your finger in warm salt water 3 times a day until the wound heals If you notice any redness, swelling, worsening pain or discharge please return to the urgent care to be evaluated.   ED Prescriptions   None    PDMP not reviewed this encounter.   Merrilee Jansky, MD 11/15/20 [id number]

## 2021-07-17 ENCOUNTER — Encounter: Payer: Self-pay | Admitting: Emergency Medicine

## 2021-07-17 ENCOUNTER — Telehealth: Payer: Self-pay

## 2021-07-17 ENCOUNTER — Ambulatory Visit
Admission: EM | Admit: 2021-07-17 | Discharge: 2021-07-17 | Disposition: A | Discharge status: Home / Self Care | Payer: BC Managed Care – PPO | Attending: Family Medicine | Admitting: Family Medicine

## 2021-07-17 ENCOUNTER — Other Ambulatory Visit: Payer: Self-pay

## 2021-07-17 DIAGNOSIS — J029 Acute pharyngitis, unspecified: Secondary | ICD-10-CM | POA: Diagnosis not present

## 2021-07-17 LAB — POCT RAPID STREP A (OFFICE): Rapid Strep A Screen: NEGATIVE

## 2021-07-17 MED ORDER — PREDNISONE 10 MG (48) PO TBPK: ORAL_TABLET | ORAL | 0 refills | Status: DC

## 2021-07-17 NOTE — Discharge Instructions (Signed)
You may use over the counter ibuprofen or acetaminophen as needed.  For a sore throat, over the counter products such as [employer] Peroxyl Mouth Sore Rinse or Chloraseptic Sore Throat Spray may provide some temporary relief. Your rapid strep test was negative today. We have sent your throat swab for culture and will let you know of any positive results. 

## 2021-07-17 NOTE — Telephone Encounter (Signed)
Per chart review tab pt was seen at [facility] 07/17/21.sending to [facility] Pool and Dr Para March who pt has appt on 09/03/21 for CPX.

## 2021-07-17 NOTE — Telephone Encounter (Signed)
Please get update on patient on Thursday.  Thanks.

## 2021-07-17 NOTE — Telephone Encounter (Signed)
[facility] Night - Client °TELEPHONE ADVICE RECORD °AccessNurse® °Patient °Name: °Graceyn A °NDREWS °Gender: Female °DOB: 11/11/1960 °Age: 61 Y 11 M 29 °D °Return °Phone °Number: °[phone] °(Primary) °Address: °City/ °State/ °Zip: °[place] °[place] °[id number] °Client [facility] Night - Client °Client Site [facility] - Night °Provider Duncan, Shaw - MD °Contact Type Call °Who Is Calling Patient / Member / Family / Caregiver °Call Type Triage / Clinical °Relationship To Patient Self °Return Phone Number [phone] (Primary) °Chief Complaint Fever (non-urgent symptom) (greater than THREE °MONTHS old) °Reason for Call Symptomatic / Request for Health Information °Initial Comment Caller states she has been having sore throat and °went to the clinic and was given prednisone. Caller °states she started getting better then turn for the °worst and she is now having a fever temp of 100.5 °Translation No °Nurse Assessment °Nurse: D'Heur Newman, RN, Adrienne Date/Time (Eastern Time): 07/17/2021 9:22:07 AM °Confirm and document reason for call. If °symptomatic, describe symptoms. °---Caller states she has been having sore throat since °January 15th and went to the [facility] on 1/19 °and was given prednisone and abx. Caller states she °started getting better then took a turn for the worse °and she is now having a fever temp of 100.5 oral. Her °ears also hurt, mostly the right side. She also has white °spots on the back of her throat. °Does the patient have any new or worsening °symptoms? ---Yes °Will a triage be completed? ---Yes °Related visit to physician within the last 2 weeks? ---No °Does the PT have any chronic conditions? (i.e. °diabetes, asthma, this includes High risk factors for °pregnancy, etc.) °---No °Is this a behavioral health or substance abuse call? ---No °Guidelines °Guideline Title Affirmed Question Affirmed Notes Nurse Date/Time  (Eastern °Time) °Sore Throat Earache also present D'Heur Newman, °RN, Adrienne °07/17/2021 9:28:02 AM °Disp. Time (Eastern °Time) Disposition Final User °PLEASE NOTE: All timestamps contained within this report are represented as Eastern Standard Time. °CONFIDENTIALTY NOTICE: This fax transmission is intended only for the addressee. It contains information that is legally privileged, confidential or °otherwise protected from use or disclosure. If you are not the intended recipient, you are strictly prohibited from reviewing, disclosing, copying using °or disseminating any of this information or taking any action in reliance on or regarding this information. If you have received this fax in error, please °notify us immediately by telephone so that we can arrange for its return to us. Phone: [phone], Toll-Free: [phone], Fax: [phone] °Page: 2 of 2 °Call Id: [id number] °07/17/2021 9:31:34 AM See PCP within 24 Hours Yes D'Heur Newman, RN, Adrienne °Caller Disagree/Comply Comply °Caller Understands Yes °PreDisposition Call Doctor °Care Advice Given Per Guideline °SEE PCP WITHIN 24 HOURS: * IF OFFICE WILL BE OPEN: You need to be examined within the next 24 hours. Call your doctor °(or NP/PA) when the office opens and make an appointment. SORE THROAT: * Sip warm chicken broth or apple juice. * Gargle °with warm salt water four times a day. To make salt water, put 1/2 teaspoon of salt in 8 oz (240 ml) of warm water. PAIN AND °FEVER MEDICINES: * For pain or fever relief, take either acetaminophen or ibuprofen. * Cold drinks, popsicles, and milk shakes °are especially good. Avoid citrus fruits. CALL BACK IF: * You become worse CARE ADVICE given per Sore Throat (Adult) °guideline. °Referrals °REFERRED TO PCP OFFIC °

## 2021-07-17 NOTE — ED Triage Notes (Signed)
Sore throat started on 1/15.  Was placed on antibiotic and steroid.  States throat never improved.  Now has fever since yesterday.  States right ear hurts now too.

## 2021-07-18 NOTE — Telephone Encounter (Signed)
Duly noted.  Thanks.  Glad to hear.  °

## 2021-07-18 NOTE — Telephone Encounter (Signed)
Spoke with patient and she states she is feeling much better and doing well today.

## 2021-07-20 NOTE — ED Provider Notes (Signed)
[facility]   [id number] 07/17/21 Arrival Time: 1026  ASSESSMENT & PLAN:  1. Sore throat    No signs of peritonsillar abscess or bacterial infection. Discussed. She would like to try longer trial of steroids.  Meds ordered this encounter  Medications   predniSONE (STERAPRED UNI-PAK 48 TAB) 10 MG (48) TBPK tablet    Sig: Take as directed.    Dispense:  48 tablet    Refill:  0    Results for orders placed or performed during the hospital encounter of 07/17/21  POCT rapid strep A  Result Value Ref Range   Rapid Strep A Screen Negative Negative   Labs Reviewed  POCT RAPID STREP A (OFFICE)    OTC analgesics and throat care as needed     Discharge Instructions      You may use over the counter ibuprofen or acetaminophen as needed.  For a sore throat, over the counter products such as Colgate Peroxyl Mouth Sore Rinse or Chloraseptic Sore Throat Spray may provide some temporary relief. Your rapid strep test was negative today. We have sent your throat swab for culture and will let you know of any positive results.      Reviewed expectations re: course of current medical issues. Questions answered. Outlined signs and symptoms indicating need for more acute intervention. Patient verbalized understanding. After Visit Summary given.   SUBJECTIVE:  Cristina Hayes is a 61 y.o. female who reports a sore throat. Has been on antibiotic and steroid. Minimal temp relief. Now ST has returned. Subj fever but unsure. R otalgia. No drainage. Normal PO intake without n/v/d.   OBJECTIVE:  Vitals:   07/17/21 1212  BP: 117/65  Pulse: 93  Resp: 18  Temp: 98.8 F (37.1 C)  TempSrc: Oral  SpO2: 97%     General appearance: alert; no distress HEENT: throat with mild erythema and cobblestoning; uvula is midline Neck: supple with FROM; no lymphadenopathy Lungs: speaks full sentences without difficulty; unlabored Abd: soft; non-tender Skin: reveals no rash; warm  and dry Psychological: alert and cooperative; normal mood and affect  No Known Allergies  Past Medical History:  Diagnosis Date   History of chicken pox    Hyperlipidemia    no per pt   Social History   Socioeconomic History   Marital status: Married    Spouse name: Not on file   Number of children: 3   Years of education: Not on file   Highest education level: Not on file  Occupational History   Occupation: Research officer, trade union  Tobacco Use   Smoking status: Never   Smokeless tobacco: Never  Vaping Use   Vaping Use: Never used  Substance and Sexual Activity   Alcohol use: No    Alcohol/week: 0.0 standard drinks   Drug use: No   Sexual activity: Not on file  Other Topics Concern   Not on file  Social History Narrative   Regular exercise-yes, 1 mile waliking a day   Diet: fruits and veggies.Marland Kitchendoes eat a lot of fried foods,water,minimal calcium in diet   Social Determinants of Health   Financial Resource Strain: Not on file  Food Insecurity: Not on file  Transportation Needs: Not on file  Physical Activity: Not on file  Stress: Not on file  Social Connections: Not on file  Intimate Partner Violence: Not on file   Family History  Problem Relation Age of Onset   Hypertension Mother    Colon polyps Mother    COPD Father  Pancreatic cancer Father    Colon polyps Father    Hypertension Sister    Colon polyps Sister    Colon polyps Sister    Colon cancer Neg Hx    Esophageal cancer Neg Hx    Rectal cancer Neg Hx    Stomach cancer Neg Hx            Mardella Layman, MD 07/20/21 1022

## 2021-09-03 ENCOUNTER — Ambulatory Visit (INDEPENDENT_AMBULATORY_CARE_PROVIDER_SITE_OTHER): Payer: BC Managed Care – PPO | Admitting: Family Medicine

## 2021-09-03 ENCOUNTER — Other Ambulatory Visit: Payer: Self-pay

## 2021-09-03 ENCOUNTER — Encounter: Payer: Self-pay | Admitting: Family Medicine

## 2021-09-03 VITALS — BP 136/78 | HR 89 | Temp 97.7°F | Ht 64.5 in | Wt 169.0 lb

## 2021-09-03 DIAGNOSIS — Z0000 Encounter for general adult medical examination without abnormal findings: Secondary | ICD-10-CM | POA: Diagnosis not present

## 2021-09-03 DIAGNOSIS — E7800 Pure hypercholesterolemia, unspecified: Secondary | ICD-10-CM | POA: Diagnosis not present

## 2021-09-03 DIAGNOSIS — Z7189 Other specified counseling: Secondary | ICD-10-CM

## 2021-09-03 LAB — COMPREHENSIVE METABOLIC PANEL
ALT: 20 U/L (ref 0–35)
AST: 24 U/L (ref 0–37)
Albumin: 4.5 g/dL (ref 3.5–5.2)
Alkaline Phosphatase: 108 U/L (ref 39–117)
BUN: 20 mg/dL (ref 6–23)
CO2: 33 mEq/L — ABNORMAL HIGH (ref 19–32)
Calcium: 10.4 mg/dL (ref 8.4–10.5)
Chloride: 103 mEq/L (ref 96–112)
Creatinine, Ser: 0.76 mg/dL (ref 0.40–1.20)
GFR: 83.57 mL/min (ref >60.00)
Glucose, Bld: 101 mg/dL — ABNORMAL HIGH (ref 70–99)
Potassium: 5.1 mEq/L (ref 3.5–5.1)
Sodium: 141 mEq/L (ref 135–145)
Total Bilirubin: 1.7 mg/dL — ABNORMAL HIGH (ref 0.2–1.2)
Total Protein: 6.8 g/dL (ref 6.0–8.3)

## 2021-09-03 LAB — LIPID PANEL
Cholesterol: 252 mg/dL — ABNORMAL HIGH (ref 0–200)
HDL: 72.5 mg/dL (ref >39.00)
LDL Cholesterol: 160 mg/dL — ABNORMAL HIGH (ref 0–99)
NonHDL: 179.49
Total CHOL/HDL Ratio: 3
Triglycerides: 98 mg/dL (ref 0.0–149.0)
VLDL: 19.6 mg/dL (ref 0.0–40.0)

## 2021-09-03 NOTE — Progress Notes (Signed)
CPE- See plan.  Routine anticipatory guidance given to patient.  See health maintenance.  The possibility exists that previously documented standard health maintenance information may have been brought forward from a previous encounter into this note.  If needed, that same information has been updated to reflect the current situation based on today's encounter.   ? ?Tdap 2022 ?Flu prev done.  ?Covid prev done.   ?PNA not due ?Shingles d/w pt.   ?Colonoscopy 2022.   ?DXA not due.  ?Mammogram per gyn.   ?Pap per gyn.  Last seen 04/2021.  ?Diet and exercise d/w pt.  Low sugar handout given to patient and discussed.   ?Advance directive d/w pt.  Husband designated if patient were incapacitated.   ? ?History of hyperlipidemia.  See notes on labs ? ?PMH and SH reviewed ? ?Meds, vitals, and allergies reviewed.  ? ?ROS: Per HPI.  Unless specifically indicated otherwise in HPI, the patient denies: ? ?General: fever. ?Eyes: acute vision changes ?ENT: sore throat ?Cardiovascular: chest pain ?Respiratory: SOB ?GI: vomiting ?GU: dysuria ?Musculoskeletal: acute back pain ?Derm: acute rash ?Neuro: acute motor dysfunction ?Psych: worsening mood ?Endocrine: polydipsia ?Heme: bleeding ?Allergy: hayfever ? ?GEN: nad, alert and oriented ?HEENT: ncat ?NECK: supple w/o LA ?CV: rrr. ?PULM: ctab, no inc wob ?ABD: soft, +bs ?EXT: no edema ?SKIN:well perfused.  ?

## 2021-09-03 NOTE — Patient Instructions (Signed)
Go to the lab on the way out.   If you have mychart we'll likely use that to update you.    ?Take care.  Glad to see you. ?Use the eat right diet and update me as needed.   ?

## 2021-09-04 DIAGNOSIS — Z7189 Other specified counseling: Secondary | ICD-10-CM | POA: Insufficient documentation

## 2021-09-04 DIAGNOSIS — Z0000 Encounter for general adult medical examination without abnormal findings: Secondary | ICD-10-CM | POA: Insufficient documentation

## 2021-09-04 NOTE — Assessment & Plan Note (Signed)
Tdap 2022 ?Flu prev done.  ?Covid prev done.   ?PNA not due ?Shingles d/w pt.   ?Colonoscopy 2022.   ?DXA not due.  ?Mammogram per gyn.   ?Pap per gyn.  Last seen 04/2021.  ?Diet and exercise d/w pt.  Low sugar handout given to patient and discussed.   ?Advance directive d/w pt.  Husband designated if patient were incapacitated.   ?

## 2021-09-04 NOTE — Assessment & Plan Note (Signed)
History of hyperlipidemia.  See notes on labs ?

## 2021-09-04 NOTE — Assessment & Plan Note (Signed)
Advance directive- d/w pt. Husband designated if patient were incapacitated.  

## 2021-09-12 ENCOUNTER — Telehealth: Payer: Self-pay

## 2021-09-12 NOTE — Telephone Encounter (Signed)
Pt returning your call

## 2021-09-12 NOTE — Telephone Encounter (Signed)
Patient notified of lab results and verbalized understanding.  ° °

## 2021-09-12 NOTE — Telephone Encounter (Signed)
Lmtcb for results 

## 2021-09-12 NOTE — Telephone Encounter (Signed)
[facility] Night - Client ?Nonclinical Telephone Record  ?AccessNurse? ?Client [facility] Night - Client ?Client Site  [facility] [place] [facility] - Night ?Provider AA - PHYSICIAN, UNKNOWN- MD ?Contact Type Call ?Who Is Calling Patient / Member / Family / Caregiver ?Caller Name Jeannifer Drakeford ?Caller Phone Number [phone] ?Call Type Message Only Information Provided ?Reason for Call Returning a Call from the Office ?Initial Comment Caller states, returning a call from office. ?Disp. Time Disposition Final User ?09/11/2021 5:03:42 PM General Information Provided Yes Merrily Pew ?Call Closed By: Merrily Pew ?Transaction Date/Time: 09/11/2021 5:02:28 PM (ET ?

## 2021-11-29 ENCOUNTER — Ambulatory Visit (INDEPENDENT_AMBULATORY_CARE_PROVIDER_SITE_OTHER)
Admission: RE | Admit: 2021-11-29 | Discharge: 2021-11-29 | Disposition: A | Discharge status: Home / Self Care | Payer: BC Managed Care – PPO | Source: Ambulatory Visit | Attending: Family | Admitting: Family

## 2021-11-29 ENCOUNTER — Encounter: Payer: Self-pay | Admitting: Family

## 2021-11-29 ENCOUNTER — Ambulatory Visit: Payer: BC Managed Care – PPO | Admitting: Family

## 2021-11-29 VITALS — BP 118/74 | HR 85 | Temp 98.3°F | Resp 16 | Ht 64.5 in | Wt 165.5 lb

## 2021-11-29 DIAGNOSIS — M62838 Other muscle spasm: Secondary | ICD-10-CM | POA: Diagnosis not present

## 2021-11-29 DIAGNOSIS — M5442 Lumbago with sciatica, left side: Secondary | ICD-10-CM | POA: Diagnosis not present

## 2021-11-29 MED ORDER — METHYLPREDNISOLONE 4 MG PO TBPK: ORAL_TABLET | ORAL | 0 refills | Status: DC

## 2021-11-29 MED ORDER — CYCLOBENZAPRINE HCL 10 MG PO TABS: 10.0000 mg | ORAL_TABLET | Freq: Three times a day (TID) | ORAL | 0 refills | Status: DC | PRN

## 2021-11-29 NOTE — Assessment & Plan Note (Signed)
rx medrol dose pack  rx flexeril 10 mg prn muscle spasm  Sciatica exercises  Xray lumbar spine  Pending results

## 2021-11-29 NOTE — Progress Notes (Signed)
Established Patient Office Visit  Subjective:  Patient ID: Cristina Hayes, female    DOB: 03-Jul-1960  Age: 61 y.o. MRN: [id number]  CC:  Chief Complaint  Patient presents with   Leg Pain    Left leg but bone down ward dull ache X 1 month    HPI Cristina Hayes is here today with concerns.   Left buttock, lower back pain with aching . When she sits down it is more painful. Two weeks ago did fele some aching in her left knee on the lateral side. Does have radiation of pain from the left lower buttock to left knee.   No urinary symptoms.  Does walk at the gym at times but has slacked off.  Advil does help a bit.    Past Medical History:  Diagnosis Date   History of chicken pox    Hyperlipidemia    no per pt    Past Surgical History:  Procedure Laterality Date   ABLATION     uterine ablation   COLONOSCOPY     OVARIAN CYST REMOVAL     TUBAL LIGATION      Family History  Problem Relation Age of Onset   Hypertension Mother    Colon polyps Mother    COPD Father    Pancreatic cancer Father    Colon polyps Father    Hypertension Sister    Colon polyps Sister    Colon polyps Sister    Multiple myeloma Brother    Breast cancer Other    Colon cancer Neg Hx    Esophageal cancer Neg Hx    Rectal cancer Neg Hx    Stomach cancer Neg Hx     Social History   Socioeconomic History   Marital status: Married    Spouse name: Not on file   Number of children: 3   Years of education: Not on file   Highest education level: Not on file  Occupational History   Occupation: Research officer, trade union  Tobacco Use   Smoking status: Never   Smokeless tobacco: Never  Vaping Use   Vaping Use: Never used  Substance and Sexual Activity   Alcohol use: No    Alcohol/week: 0.0 standard drinks of alcohol   Drug use: No   Sexual activity: Not on file  Other Topics Concern   Not on file  Social History Narrative   Married 1979   3 kids   Retired from [employer],  Geologist, engineering in high school special needs   Enjoys camping/RV   Social Determinants of Corporate investment banker Strain: Not on [employer] Insecurity: Not on file  Transportation Needs: Not on file  Physical Activity: Not on file  Stress: Not on file  Social Connections: Not on file  Intimate Partner Violence: Not on file    No outpatient medications prior to visit.   No facility-administered medications prior to visit.    No Known Allergies      Objective:    Physical Exam Constitutional:      General: She is not in acute distress.    Appearance: Normal appearance. She is normal weight. She is ill-appearing and diaphoretic. She is not toxic-appearing.  Pulmonary:     Effort: Pulmonary effort is normal.  Musculoskeletal:     Lumbar back: Tenderness (left sciatic notch) present. Normal range of motion (slight tenderness left lower buttock). Positive left straight leg raise test.  Skin:    General: Skin is warm.  Neurological:     Mental Status: She is alert.     BP 118/74   Pulse 85   Temp 98.3 F (36.8 C)   Resp 16   Ht 5' 4.5" (1.638 m)   Wt 165 lb 8 oz (75.1 kg)   SpO2 96%   BMI 27.97 kg/m  Wt Readings from Last 3 Encounters:  11/29/21 165 lb 8 oz (75.1 kg)  09/03/21 169 lb (76.7 kg)  09/04/20 162 lb 9.6 oz (73.8 kg)     Health Maintenance Due  Topic Date Due   Zoster Vaccines- Shingrix (1 of 2) Never done   COVID-19 Vaccine (3 - Pfizer series) 12/17/2019   PAP SMEAR-Modifier  10/23/2020   MAMMOGRAM  03/27/2021    There are no preventive care reminders to display for this patient.  Lab Results  Component Value Date   TSH 4.37 08/10/2019   Lab Results  Component Value Date   WBC 6.0 06/19/2020   HGB 13.5 06/19/2020   HCT 40.4 06/19/2020   MCV 85.5 06/19/2020   PLT 290.0 06/19/2020   Lab Results  Component Value Date   NA 141 09/03/2021   K 5.1 09/03/2021   CO2 33 (H) 09/03/2021   GLUCOSE 101 (H) 09/03/2021   BUN 20  09/03/2021   CREATININE 0.76 09/03/2021   BILITOT 1.7 (H) 09/03/2021   ALKPHOS 108 09/03/2021   AST 24 09/03/2021   ALT 20 09/03/2021   PROT 6.8 09/03/2021   ALBUMIN 4.5 09/03/2021   CALCIUM 10.4 09/03/2021   GFR 83.57 09/03/2021   No results found for: "HGBA1C"    Assessment & Plan:   Problem List Items Addressed This Visit       Nervous and Auditory   Acute left-sided low back pain with left-sided sciatica    rx medrol dose pack  rx flexeril 10 mg prn muscle spasm  Sciatica exercises  Xray lumbar spine  Pending results       Relevant Medications   cyclobenzaprine (FLEXERIL) 10 MG tablet   methylPREDNISolone (MEDROL DOSEPAK) 4 MG TBPK tablet   Other Relevant Orders   DG Lumbar Spine Complete     Other   Muscle spasm - Primary    Flexeril 10 mg prn       Relevant Medications   cyclobenzaprine (FLEXERIL) 10 MG tablet   methylPREDNISolone (MEDROL DOSEPAK) 4 MG TBPK tablet   Other Relevant Orders   DG Lumbar Spine Complete    Meds ordered this encounter  Medications   cyclobenzaprine (FLEXERIL) 10 MG tablet    Sig: Take 1 tablet (10 mg total) by mouth 3 (three) times daily as needed for muscle spasms.    Dispense:  30 tablet    Refill:  0    Order Specific Question:   Supervising Provider    Answer:   BEDSOLE, AMY E [[id number]]   methylPREDNISolone (MEDROL DOSEPAK) 4 MG TBPK tablet    Sig: Take per package instructions    Dispense:  21 tablet    Refill:  0    Order Specific Question:   Supervising Provider    Answer:   BEDSOLE, AMY E [[id number]]    Follow-up: Return if symptoms worsen or fail to improve with pcp.    Mort Sawyers, FNP

## 2021-11-29 NOTE — Assessment & Plan Note (Signed)
Flexeril 10 mg prn

## 2021-12-16 ENCOUNTER — Telehealth: Payer: Self-pay | Admitting: Family Medicine

## 2021-12-16 NOTE — Telephone Encounter (Signed)
Pt scheduled for tomorrow for shingles shot.

## 2021-12-16 NOTE — Telephone Encounter (Signed)
Pt called and wants to go ahead and get her shingles shot, she stated "Dr. Para March recommended this and to let him know when she would like to get those." She last saw him on 3.28.2023. Please advise, thank you.  Callback Number: [phone]

## 2021-12-16 NOTE — Telephone Encounter (Signed)
Okay to scheduled NV to have done?

## 2021-12-16 NOTE — Telephone Encounter (Signed)
Please schedule NV for shingrix.  Thanks.

## 2021-12-17 ENCOUNTER — Ambulatory Visit (INDEPENDENT_AMBULATORY_CARE_PROVIDER_SITE_OTHER): Payer: BC Managed Care – PPO | Admitting: *Deleted

## 2021-12-17 DIAGNOSIS — Z23 Encounter for immunization: Secondary | ICD-10-CM | POA: Diagnosis not present

## 2022-02-20 ENCOUNTER — Telehealth: Payer: Self-pay | Admitting: Family Medicine

## 2022-02-20 NOTE — Telephone Encounter (Signed)
Pt stopped by & dropped off some check off forms Dr. Para March requested. Forms are in pcp folder.

## 2022-02-21 NOTE — Telephone Encounter (Signed)
Forms have been palced in Dr. Armanda Heritage inbox

## 2022-02-26 ENCOUNTER — Other Ambulatory Visit: Payer: Self-pay | Admitting: Family Medicine

## 2022-02-26 ENCOUNTER — Ambulatory Visit (INDEPENDENT_AMBULATORY_CARE_PROVIDER_SITE_OTHER): Payer: BC Managed Care – PPO | Admitting: *Deleted

## 2022-02-26 DIAGNOSIS — Z111 Encounter for screening for respiratory tuberculosis: Secondary | ICD-10-CM

## 2022-02-26 NOTE — Telephone Encounter (Signed)
Patient came in today for TB skin test and will return on Friday to have read. Patient would like to pick the form up when she comes in to have read.

## 2022-02-26 NOTE — Telephone Encounter (Signed)
Noted. Thanks.

## 2022-02-28 LAB — TB SKIN TEST
Induration: 0 mm
TB Skin Test: NEGATIVE

## 2022-04-24 ENCOUNTER — Ambulatory Visit: Payer: BC Managed Care – PPO

## 2022-05-07 ENCOUNTER — Ambulatory Visit (INDEPENDENT_AMBULATORY_CARE_PROVIDER_SITE_OTHER): Payer: BC Managed Care – PPO

## 2022-05-07 DIAGNOSIS — Z23 Encounter for immunization: Secondary | ICD-10-CM | POA: Diagnosis not present

## 2022-05-08 LAB — RESULTS CONSOLE HPV: CHL HPV: NEGATIVE

## 2022-05-08 LAB — HM PAP SMEAR: HM Pap smear: NORMAL

## 2022-05-21 ENCOUNTER — Ambulatory Visit (INDEPENDENT_AMBULATORY_CARE_PROVIDER_SITE_OTHER): Payer: BC Managed Care – PPO

## 2022-05-21 DIAGNOSIS — Z23 Encounter for immunization: Secondary | ICD-10-CM | POA: Diagnosis not present

## 2022-07-07 IMAGING — DX DG LUMBAR SPINE COMPLETE 4+V
5 series · 5 of 5 positions shown · non-contrast
Comparison: None Available.

CLINICAL DATA: Low back pain

EXAM:
LUMBAR SPINE - COMPLETE 4+ VIEW

[lumbar spine ap]
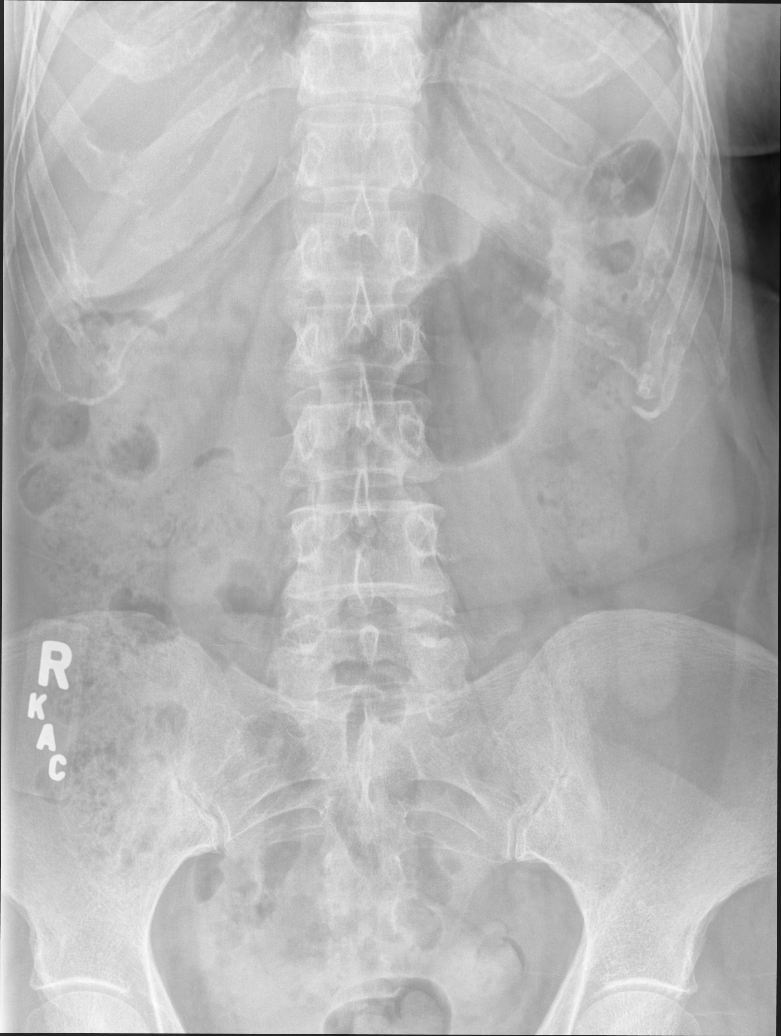

[lumbar spine mlo (1 of 2)]
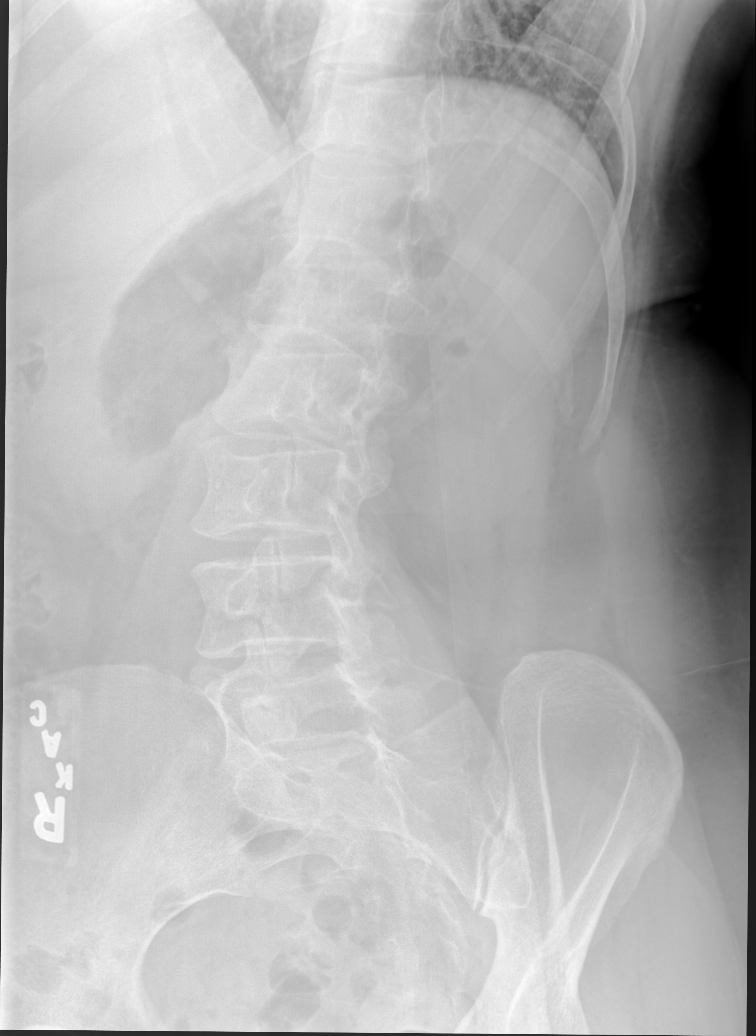

[lumbar spine mlo (2 of 2)]
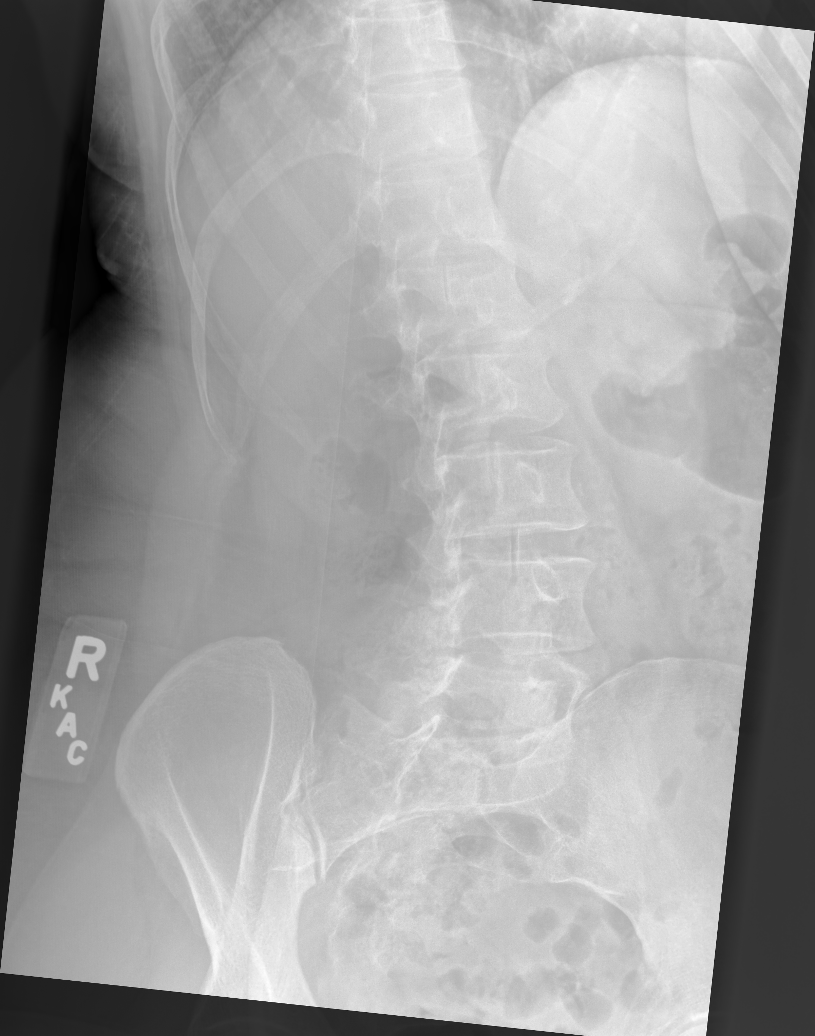

[lumbar spine lat (1 of 2)]
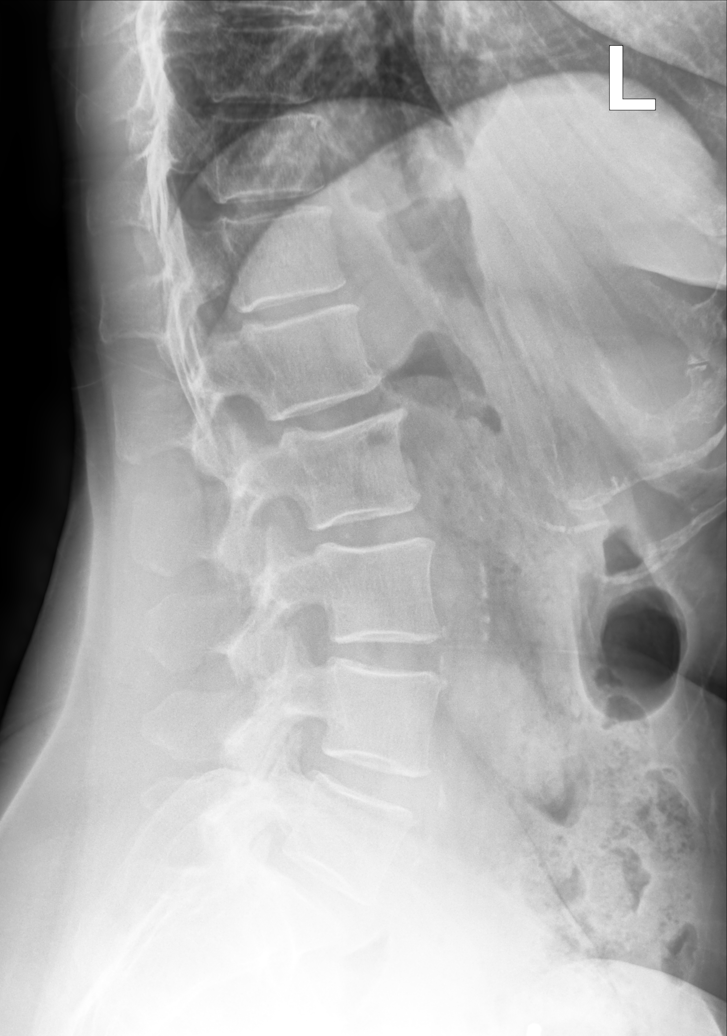

[lumbar spine lat (2 of 2)]
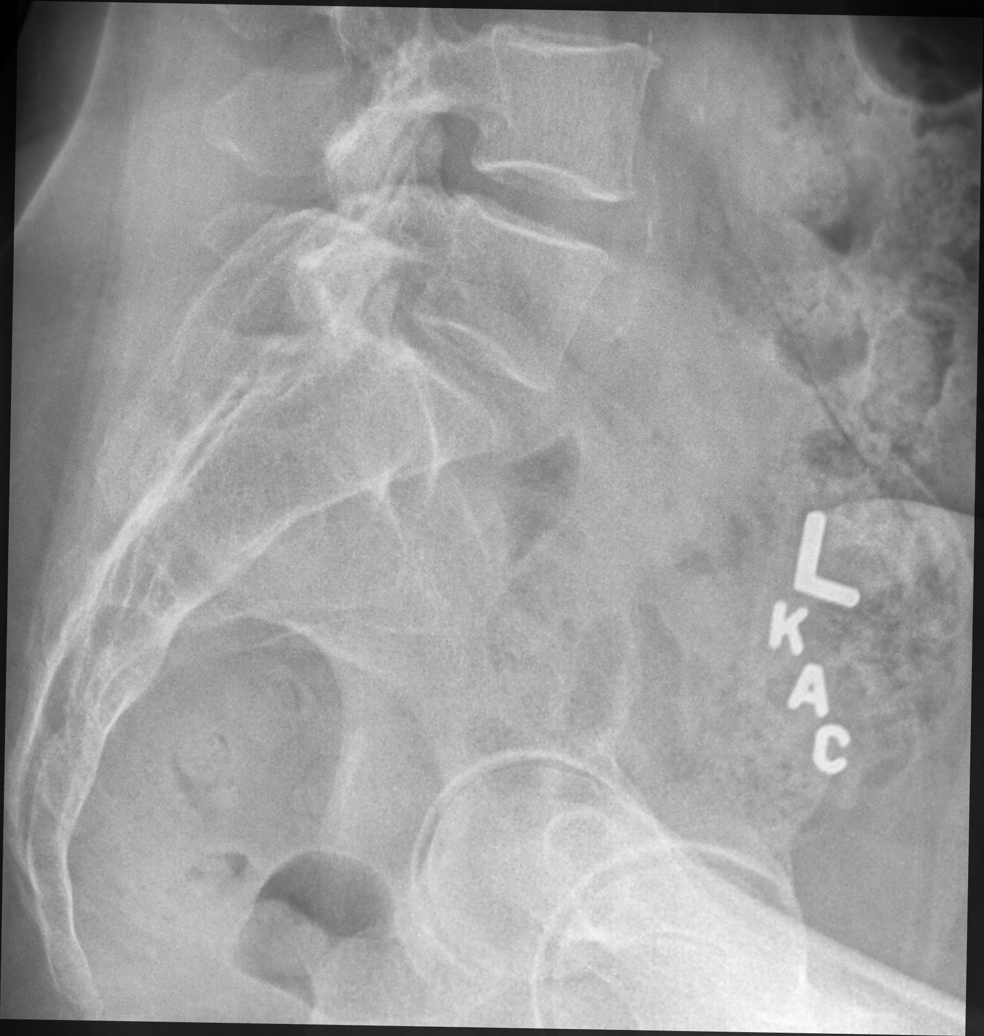

[5 of 5 positions shown; findings below may reference images not displayed]

FINDINGS: Lumbar vertebral body height are preserved without evidence of
fracture. Minimal grade 1 anterolisthesis of L3 on L4. No
spondylolysis visualized. Intervertebral disc spaces are preserved.
Mild facet arthropathy in the lower lumbar spine.
IMPRESSION: Mild degenerative changes with no fracture identified.

## 2022-08-31 ENCOUNTER — Other Ambulatory Visit: Payer: Self-pay | Admitting: Family Medicine

## 2022-08-31 DIAGNOSIS — E7800 Pure hypercholesterolemia, unspecified: Secondary | ICD-10-CM

## 2022-09-01 ENCOUNTER — Other Ambulatory Visit (INDEPENDENT_AMBULATORY_CARE_PROVIDER_SITE_OTHER): Payer: BC Managed Care – PPO

## 2022-09-01 DIAGNOSIS — E7800 Pure hypercholesterolemia, unspecified: Secondary | ICD-10-CM

## 2022-09-01 LAB — LIPID PANEL
Cholesterol: 200 mg/dL (ref 0–200)
HDL: 64.1 mg/dL (ref >39.00)
LDL Cholesterol: 119 mg/dL — ABNORMAL HIGH (ref 0–99)
NonHDL: 135.67
Total CHOL/HDL Ratio: 3
Triglycerides: 83 mg/dL (ref 0.0–149.0)
VLDL: 16.6 mg/dL (ref 0.0–40.0)

## 2022-09-01 LAB — COMPREHENSIVE METABOLIC PANEL
ALT: 14 U/L (ref 0–35)
AST: 18 U/L (ref 0–37)
Albumin: 4.3 g/dL (ref 3.5–5.2)
Alkaline Phosphatase: 105 U/L (ref 39–117)
BUN: 19 mg/dL (ref 6–23)
CO2: 31 mEq/L (ref 19–32)
Calcium: 9.8 mg/dL (ref 8.4–10.5)
Chloride: 103 mEq/L (ref 96–112)
Creatinine, Ser: 0.73 mg/dL (ref 0.40–1.20)
GFR: 87.09 mL/min (ref >60.00)
Glucose, Bld: 95 mg/dL (ref 70–99)
Potassium: 4.1 mEq/L (ref 3.5–5.1)
Sodium: 141 mEq/L (ref 135–145)
Total Bilirubin: 1.3 mg/dL — ABNORMAL HIGH (ref 0.2–1.2)
Total Protein: 6.9 g/dL (ref 6.0–8.3)

## 2022-09-08 ENCOUNTER — Encounter: Payer: Self-pay | Admitting: Family Medicine

## 2022-09-08 ENCOUNTER — Ambulatory Visit (INDEPENDENT_AMBULATORY_CARE_PROVIDER_SITE_OTHER): Payer: BC Managed Care – PPO | Admitting: Family Medicine

## 2022-09-08 VITALS — BP 120/70 | HR 78 | Temp 97.9°F | Ht 64.5 in | Wt 173.0 lb

## 2022-09-08 DIAGNOSIS — Z0000 Encounter for general adult medical examination without abnormal findings: Secondary | ICD-10-CM

## 2022-09-08 DIAGNOSIS — Z7189 Other specified counseling: Secondary | ICD-10-CM

## 2022-09-08 DIAGNOSIS — K219 Gastro-esophageal reflux disease without esophagitis: Secondary | ICD-10-CM

## 2022-09-08 MED ORDER — FAMOTIDINE 20 MG PO TABS: 20.0000 mg | ORAL_TABLET | Freq: Two times a day (BID) | ORAL | Status: DC

## 2022-09-08 NOTE — Progress Notes (Unsigned)
CPE- See plan.  Routine anticipatory guidance given to patient.  See health maintenance.  The possibility exists that previously documented standard health maintenance information may have been brought forward from a previous encounter into this note.  If needed, that same information has been updated to reflect the current situation based on today's encounter.    Tdap 2022 Flu prev done.  Covid prev done.   PNA not due Shingles prev done.   Colonoscopy 2022.   DXA not due.  Mammogram per gyn.   Pap per gyn.  Last seen 04/2022 Diet and exercise d/w pt.   Advance directive d/w pt.  Husband designated if patient were incapacitated.    Labs d/w pt, HLD clearly improved.  She is working on diet and exercise.   Family noted patient occ cough.  No sputum.  Some occ L sided chest dull discomfort but isn't exertional- mild.  Intermittent but going on for months. Not worse with walking for exercise. She can walk 1 mile or more at a time- not worse with that.    PMH and SH reviewed  Meds, vitals, and allergies reviewed.   ROS: Per HPI.  Unless specifically indicated otherwise in HPI, the patient denies:  General: fever. Eyes: acute vision changes ENT: sore throat Cardiovascular: chest pain Respiratory: SOB GI: vomiting GU: dysuria Musculoskeletal: acute back pain Derm: acute rash Neuro: acute motor dysfunction Psych: worsening mood Endocrine: polydipsia Heme: bleeding Allergy: hayfever  GEN: nad, alert and oriented HEENT: mucous membranes moist NECK: supple w/o LA CV: rrr. PULM: ctab, no inc wob ABD: soft, +bs EXT: no edema SKIN: no acute rash

## 2022-09-08 NOTE — Patient Instructions (Signed)
Try taking pepcid and let me know if that isn't helping.  Take care.  Glad to see you.

## 2022-09-10 DIAGNOSIS — K219 Gastro-esophageal reflux disease without esophagitis: Secondary | ICD-10-CM | POA: Insufficient documentation

## 2022-09-10 DIAGNOSIS — Z0000 Encounter for general adult medical examination without abnormal findings: Secondary | ICD-10-CM | POA: Insufficient documentation

## 2022-09-10 NOTE — Assessment & Plan Note (Signed)
She has good exertional capacity, does not have exertional symptoms.  She does have occasional cough.  Unclear if this is an atypical presentation of GERD.  Anatomy discussed with patient.  Reassuring exam.  Okay for outpatient follow-up.  Reasonable to try taking pepcid and let me know if that isn't helping.

## 2022-09-10 NOTE — Assessment & Plan Note (Signed)
Tdap 2022 Flu prev done.  Covid prev done.   PNA not due Shingles prev done.   Colonoscopy 2022.   DXA not due.  Mammogram per gyn.   Pap per gyn.  Last seen 04/2022 Diet and exercise d/w pt.   Advance directive d/w pt.  Husband designated if patient were incapacitated.

## 2022-09-10 NOTE — Assessment & Plan Note (Signed)
Advance directive- d/w pt. Husband designated if patient were incapacitated.  

## 2023-03-03 ENCOUNTER — Encounter: Payer: Self-pay | Admitting: Family Medicine

## 2023-03-03 ENCOUNTER — Ambulatory Visit: Payer: BC Managed Care – PPO | Admitting: Family Medicine

## 2023-03-03 VITALS — BP 132/68 | HR 78 | Temp 98.5°F | Ht 64.5 in | Wt 174.4 lb

## 2023-03-03 DIAGNOSIS — R300 Dysuria: Secondary | ICD-10-CM

## 2023-03-03 DIAGNOSIS — N3001 Acute cystitis with hematuria: Secondary | ICD-10-CM | POA: Insufficient documentation

## 2023-03-03 LAB — POC URINALSYSI DIPSTICK (AUTOMATED)
Bilirubin, UA: NEGATIVE
Glucose, UA: NEGATIVE
Ketones, UA: NEGATIVE
Nitrite, UA: NEGATIVE
Protein, UA: POSITIVE — AB
Spec Grav, UA: 1.015 (ref 1.010–1.025)
Urobilinogen, UA: 0.2 E.U./dL
pH, UA: 6 (ref 5.0–8.0)

## 2023-03-03 MED ORDER — CEPHALEXIN 500 MG PO CAPS: 500.0000 mg | ORAL_CAPSULE | Freq: Three times a day (TID) | ORAL | 0 refills | Status: DC

## 2023-03-03 NOTE — Progress Notes (Signed)
Patient ID: Cristina Hayes, female    DOB: 05/17/1961, 62 y.o.   MRN: [id number]  This visit was conducted in person.  BP 132/68 (BP Location: Right Arm, Patient Position: Sitting, Cuff Size: Normal)   Pulse 78   Temp 98.5 F (36.9 C) (Temporal)   Ht 5' 4.5" (1.638 m)   Wt 174 lb 6 oz (79.1 kg)   SpO2 99%   BMI 29.47 kg/m    CC:  Chief Complaint  Patient presents with   Urinary Urgency   Dysuria    Subjective:   HPI: ANDREYAH DEMARIO is a 62 y.o. female presenting on 03/03/2023 for Urinary Urgency and Dysuria  Dysuria  This is a new problem. The current episode started yesterday. The problem has been gradually worsening. The quality of the pain is described as burning. The pain is at a severity of 7/10. There has been no fever. She is Sexually active. There is No history of pyelonephritis. Associated symptoms include flank pain, frequency, nausea and urgency. Pertinent negatives include no chills, discharge, hematuria, hesitancy or vomiting. She has tried increased fluids for the symptoms. The treatment provided no relief. There is no history of catheterization, kidney stones, recurrent UTIs, a single kidney, urinary stasis or a urological procedure.        Relevant past medical, surgical, family and social history reviewed and updated as indicated. Interim medical history since our last visit reviewed. Allergies and medications reviewed and updated. Outpatient Medications Prior to Visit  Medication Sig Dispense Refill   famotidine (PEPCID) 20 MG tablet Take 20 mg by mouth 2 (two) times daily as needed for heartburn or indigestion.     famotidine (PEPCID) 20 MG tablet Take 1 tablet (20 mg total) by mouth 2 (two) times daily.     No facility-administered medications prior to visit.     Per HPI unless specifically indicated in ROS section below Review of Systems  Constitutional:  Negative for chills, fatigue and fever.  HENT:  Negative for congestion.   Eyes:   Negative for pain.  Respiratory:  Negative for cough and shortness of breath.   Cardiovascular:  Negative for chest pain, palpitations and leg swelling.  Gastrointestinal:  Positive for nausea. Negative for abdominal pain and vomiting.  Genitourinary:  Positive for dysuria, flank pain, frequency and urgency. Negative for hematuria, hesitancy and vaginal bleeding.  Musculoskeletal:  Negative for back pain.  Neurological:  Negative for syncope, light-headedness and headaches.  Psychiatric/Behavioral:  Negative for dysphoric mood.    Objective:  BP 132/68 (BP Location: Right Arm, Patient Position: Sitting, Cuff Size: Normal)   Pulse 78   Temp 98.5 F (36.9 C) (Temporal)   Ht 5' 4.5" (1.638 m)   Wt 174 lb 6 oz (79.1 kg)   SpO2 99%   BMI 29.47 kg/m   Wt Readings from Last 3 Encounters:  03/03/23 174 lb 6 oz (79.1 kg)  09/08/22 173 lb (78.5 kg)  11/29/21 165 lb 8 oz (75.1 kg)      Physical Exam Constitutional:      General: She is not in acute distress.    Appearance: Normal appearance. She is well-developed. She is not ill-appearing or toxic-appearing.  HENT:     Head: Normocephalic.     Right Ear: Hearing, tympanic membrane, ear canal and external ear normal. Tympanic membrane is not erythematous, retracted or bulging.     Left Ear: Hearing, tympanic membrane, ear canal and external ear normal. Tympanic membrane is not erythematous,  retracted or bulging.     Nose: No mucosal edema or rhinorrhea.     Right Sinus: No maxillary sinus tenderness or frontal sinus tenderness.     Left Sinus: No maxillary sinus tenderness or frontal sinus tenderness.     Mouth/Throat:     Mouth: Oropharynx is clear and moist and mucous membranes are normal.     Pharynx: Uvula midline.  Eyes:     General: Lids are normal. Lids are everted, no foreign bodies appreciated.     Extraocular Movements: EOM normal.     Conjunctiva/sclera: Conjunctivae normal.     Pupils: Pupils are equal, round, and reactive  to light.  Neck:     Thyroid: No thyroid mass or thyromegaly.     Vascular: No carotid bruit.     Trachea: Trachea normal.  Cardiovascular:     Rate and Rhythm: Normal rate and regular rhythm.     Pulses: Normal pulses.     Heart sounds: Normal heart sounds, S1 normal and S2 normal. No murmur heard.    No friction rub. No gallop.  Pulmonary:     Effort: Pulmonary effort is normal. No tachypnea or respiratory distress.     Breath sounds: Normal breath sounds. No decreased breath sounds, wheezing, rhonchi or rales.  Abdominal:     General: Bowel sounds are normal.     Palpations: Abdomen is soft.     Tenderness: There is abdominal tenderness in the left lower quadrant. There is left CVA tenderness. There is no rebound.  Musculoskeletal:     Cervical back: Normal range of motion and neck supple.  Skin:    General: Skin is warm, dry and intact.     Findings: No rash.  Neurological:     Mental Status: She is alert.  Psychiatric:        Mood and Affect: Mood is not anxious or depressed.        Speech: Speech normal.        Behavior: Behavior normal. Behavior is cooperative.        Thought Content: Thought content normal.        Cognition and Memory: Cognition and memory normal.        Judgment: Judgment normal.       Results for orders placed or performed in visit on 03/03/23  POCT Urinalysis Dipstick (Automated)  Result Value Ref Range   Color, UA Yellow    Clarity, UA Clear    Glucose, UA Negative Negative   Bilirubin, UA Negative    Ketones, UA Negative    Spec Grav, UA 1.015 1.010 - 1.025   Blood, UA Moderate (2+)    pH, UA 6.0 5.0 - 8.0   Protein, UA Positive (A) Negative   Urobilinogen, UA 0.2 0.2 or 1.0 E.U./dL   Nitrite, UA Negative    Leukocytes, UA Trace (A) Negative    Assessment and Plan  Dysuria -     POCT Urinalysis Dipstick (Automated) -     Urine Culture  Acute cystitis with hematuria Assessment & Plan: Acute, urinalysis and symptoms most  consistent with urinary tract infection.  Will treat empirically while awaiting urine culture with cephalexin 500 mg 3 times daily x 7 days. Recommended pushing fluids, cranberry.  Return and ER precautions reviewed with patient in detail.   Other orders -     Cephalexin; Take 1 capsule (500 mg total) by mouth 3 (three) times daily.  Dispense: 21 capsule; Refill: 0    No  follow-ups on file.   Kerby Nora, MD

## 2023-03-03 NOTE — Assessment & Plan Note (Signed)
Acute, urinalysis and symptoms most consistent with urinary tract infection.  Will treat empirically while awaiting urine culture with cephalexin 500 mg 3 times daily x 7 days. Recommended pushing fluids, cranberry.  Return and ER precautions reviewed with patient in detail.

## 2023-03-05 LAB — URINE CULTURE
MICRO NUMBER:: 15508784
SPECIMEN QUALITY:: ADEQUATE

## 2023-07-14 ENCOUNTER — Telehealth: Payer: Self-pay

## 2023-07-14 DIAGNOSIS — E7800 Pure hypercholesterolemia, unspecified: Secondary | ICD-10-CM

## 2023-07-14 NOTE — Telephone Encounter (Signed)
Copied from CRM [id number]. Topic: Clinical - Request for Lab/Test Order >> Jul 14, 2023 11:51 AM Drema Balzarine wrote: Reason for CRM: Patient scheduled physical for 09/10/23 - wants to know if she needs labs prior? Please call to let her know

## 2023-07-15 NOTE — Telephone Encounter (Signed)
 Please call and schedule lab appt prior to CPE visit in april

## 2023-07-15 NOTE — Telephone Encounter (Signed)
I put in the orders for labs . Thanks.

## 2023-09-03 ENCOUNTER — Other Ambulatory Visit: Payer: Self-pay

## 2023-09-04 ENCOUNTER — Other Ambulatory Visit: Payer: Self-pay

## 2023-09-10 ENCOUNTER — Encounter: Payer: Self-pay | Admitting: Family Medicine

## 2023-09-29 ENCOUNTER — Other Ambulatory Visit: Payer: Self-pay

## 2023-10-05 ENCOUNTER — Encounter: Payer: Self-pay | Admitting: Family Medicine

## 2023-10-05 ENCOUNTER — Ambulatory Visit (INDEPENDENT_AMBULATORY_CARE_PROVIDER_SITE_OTHER): Payer: Self-pay | Admitting: Family Medicine

## 2023-10-05 ENCOUNTER — Other Ambulatory Visit (INDEPENDENT_AMBULATORY_CARE_PROVIDER_SITE_OTHER): Payer: Self-pay

## 2023-10-05 VITALS — BP 128/64 | HR 59 | Temp 98.6°F | Ht 64.25 in | Wt 168.8 lb

## 2023-10-05 DIAGNOSIS — K219 Gastro-esophageal reflux disease without esophagitis: Secondary | ICD-10-CM

## 2023-10-05 DIAGNOSIS — Z0000 Encounter for general adult medical examination without abnormal findings: Secondary | ICD-10-CM

## 2023-10-05 DIAGNOSIS — Z7189 Other specified counseling: Secondary | ICD-10-CM

## 2023-10-05 DIAGNOSIS — E7800 Pure hypercholesterolemia, unspecified: Secondary | ICD-10-CM

## 2023-10-05 DIAGNOSIS — H9190 Unspecified hearing loss, unspecified ear: Secondary | ICD-10-CM

## 2023-10-05 LAB — COMPREHENSIVE METABOLIC PANEL WITH GFR
ALT: 15 U/L (ref 0–35)
AST: 19 U/L (ref 0–37)
Albumin: 4.4 g/dL (ref 3.5–5.2)
Alkaline Phosphatase: 93 U/L (ref 39–117)
BUN: 15 mg/dL (ref 6–23)
CO2: 31 meq/L (ref 19–32)
Calcium: 9.8 mg/dL (ref 8.4–10.5)
Chloride: 101 meq/L (ref 96–112)
Creatinine, Ser: 0.7 mg/dL (ref 0.40–1.20)
GFR: 90.89 mL/min (ref >60.00)
Glucose, Bld: 93 mg/dL (ref 70–99)
Potassium: 3.7 meq/L (ref 3.5–5.1)
Sodium: 139 meq/L (ref 135–145)
Total Bilirubin: 1.5 mg/dL — ABNORMAL HIGH (ref 0.2–1.2)
Total Protein: 7 g/dL (ref 6.0–8.3)

## 2023-10-05 LAB — LIPID PANEL
Cholesterol: 218 mg/dL — ABNORMAL HIGH (ref 0–200)
HDL: 64.8 mg/dL (ref >39.00)
LDL Cholesterol: 132 mg/dL — ABNORMAL HIGH (ref 0–99)
NonHDL: 153.02
Total CHOL/HDL Ratio: 3
Triglycerides: 105 mg/dL (ref 0.0–149.0)
VLDL: 21 mg/dL (ref 0.0–40.0)

## 2023-10-05 NOTE — Progress Notes (Unsigned)
 I have personally reviewed the Medicare Annual Wellness questionnaire and have noted 1. The patient's medical and social history 2. Their use of alcohol, tobacco or illicit drugs 3. Their current medications and supplements 4. The patient's functional ability including ADL's, fall risks, home safety risks and hearing or visual             impairment. 5. Diet and physical activities 6. Evidence for depression or mood disorders  The patients weight, height, BMI have been recorded in the chart and visual acuity is per eye clinic.  I have made referrals, counseling and provided education to the patient based review of the above and I have provided the pt with a written personalized care plan for preventive services.  Provider list updated- see scanned forms.  Routine anticipatory guidance given to patient.  See health maintenance. The possibility exists that previously documented standard health maintenance information may have been brought forward from a previous encounter into this note.  If needed, that same information has been updated to reflect the current situation based on today's encounter.    Flu Shingles PNA Tetanus Colon  Breast cancer screening Prostate cancer screening Advance directive Cognitive function addressed- see scanned forms- and if abnormal then additional documentation follows.   In addition to [facility] Wellness, follow up visit for the below conditions:  HLD with labs pending.   D/w pt about audiology eval in [place] if possible.   PMH and SH reviewed  Meds, vitals, and allergies reviewed.   ROS: Per HPI.  Unless specifically indicated otherwise in HPI, the patient denies:  General: fever. Eyes: acute vision changes ENT: sore throat Cardiovascular: chest pain Respiratory: SOB GI: vomiting GU: dysuria Musculoskeletal: acute back pain Derm: acute rash Neuro: acute motor dysfunction Psych: worsening mood Endocrine: polydipsia Heme:  bleeding Allergy: hayfever  GEN: nad, alert and oriented HEENT: mucous membranes moist, TM wnl B.  NECK: supple w/o LA CV: rrr. PULM: ctab, no inc wob ABD: soft, +bs EXT: no edema SKIN: no acute rash

## 2023-10-05 NOTE — Progress Notes (Unsigned)
 Hearing Screening (Inadequate exam)  Method: Audiometry   4000Hz   Right ear 40  Left ear    Vision Screening   Right eye Left eye Both eyes  Without correction 20/20 20/40 20/20   With correction

## 2023-10-05 NOTE — Patient Instructions (Addendum)
 Pneumonia 20 when possible.   I would get a flu shot each fall.   Take care.  Glad to see you.

## 2023-10-07 DIAGNOSIS — Z0000 Encounter for general adult medical examination without abnormal findings: Secondary | ICD-10-CM | POA: Insufficient documentation

## 2023-10-07 NOTE — Assessment & Plan Note (Signed)
  Flu yearly Shingles previously done PNA discussed with patient Tetanus 2022 COVID previously done Colonoscopy 2022 Mammogram and bone density test to be done through gynecology. Advance directive-husband designated if patient were incapacitated. Cognitive function addressed- see scanned forms- and if abnormal then additional documentation follows.   Hearing and vision screening done.  EKG in chart. Discussed audiology evaluation in [place] if possible.

## 2023-10-07 NOTE — Assessment & Plan Note (Signed)
Advance directive- husband designated if patient were incapacitated.  

## 2023-10-07 NOTE — Assessment & Plan Note (Signed)
Continue Famotidine

## 2023-10-07 NOTE — Assessment & Plan Note (Signed)
 See notes on labs.

## 2023-10-11 ENCOUNTER — Encounter: Payer: Self-pay | Admitting: Family Medicine

## 2023-10-14 ENCOUNTER — Other Ambulatory Visit: Payer: Self-pay | Admitting: Obstetrics and Gynecology

## 2023-10-14 ENCOUNTER — Encounter: Payer: Self-pay | Admitting: Obstetrics and Gynecology

## 2023-10-14 DIAGNOSIS — Z1231 Encounter for screening mammogram for malignant neoplasm of breast: Secondary | ICD-10-CM | POA: Diagnosis not present

## 2023-10-14 DIAGNOSIS — R5381 Other malaise: Secondary | ICD-10-CM

## 2023-10-14 DIAGNOSIS — Z01419 Encounter for gynecological examination (general) (routine) without abnormal findings: Secondary | ICD-10-CM | POA: Diagnosis not present

## 2023-10-19 ENCOUNTER — Encounter: Payer: Self-pay | Admitting: Family Medicine

## 2023-10-22 DIAGNOSIS — L821 Other seborrheic keratosis: Secondary | ICD-10-CM | POA: Diagnosis not present

## 2023-10-22 DIAGNOSIS — I8392 Asymptomatic varicose veins of left lower extremity: Secondary | ICD-10-CM | POA: Diagnosis not present

## 2023-10-22 DIAGNOSIS — D2239 Melanocytic nevi of other parts of face: Secondary | ICD-10-CM | POA: Diagnosis not present

## 2023-10-22 DIAGNOSIS — D2261 Melanocytic nevi of right upper limb, including shoulder: Secondary | ICD-10-CM | POA: Diagnosis not present

## 2023-10-22 DIAGNOSIS — I8391 Asymptomatic varicose veins of right lower extremity: Secondary | ICD-10-CM | POA: Diagnosis not present

## 2023-10-22 DIAGNOSIS — D2262 Melanocytic nevi of left upper limb, including shoulder: Secondary | ICD-10-CM | POA: Diagnosis not present

## 2023-10-22 DIAGNOSIS — D2371 Other benign neoplasm of skin of right lower limb, including hip: Secondary | ICD-10-CM | POA: Diagnosis not present

## 2023-10-22 DIAGNOSIS — D225 Melanocytic nevi of trunk: Secondary | ICD-10-CM | POA: Diagnosis not present

## 2023-10-22 DIAGNOSIS — D1801 Hemangioma of skin and subcutaneous tissue: Secondary | ICD-10-CM | POA: Diagnosis not present

## 2023-11-09 DIAGNOSIS — H906 Mixed conductive and sensorineural hearing loss, bilateral: Secondary | ICD-10-CM | POA: Diagnosis not present

## 2023-11-09 DIAGNOSIS — H9313 Tinnitus, bilateral: Secondary | ICD-10-CM | POA: Diagnosis not present

## 2024-05-17 ENCOUNTER — Ambulatory Visit (INDEPENDENT_AMBULATORY_CARE_PROVIDER_SITE_OTHER): Admitting: Family Medicine

## 2024-05-17 ENCOUNTER — Ambulatory Visit (INDEPENDENT_AMBULATORY_CARE_PROVIDER_SITE_OTHER)
Admission: RE | Admit: 2024-05-17 | Discharge: 2024-05-17 | Disposition: A | Discharge status: Home / Self Care | Source: Ambulatory Visit | Attending: Family Medicine | Admitting: Family Medicine

## 2024-05-17 VITALS — BP 136/78 | HR 77 | Temp 98.4°F | Ht 64.25 in | Wt 170.5 lb

## 2024-05-17 DIAGNOSIS — M25562 Pain in left knee: Secondary | ICD-10-CM

## 2024-05-17 MED ORDER — DICLOFENAC SODIUM 1 % EX GEL: 2.0000 g | Freq: Four times a day (QID) | CUTANEOUS | 1 refills | Status: DC | PRN

## 2024-05-17 NOTE — Progress Notes (Unsigned)
 L knee pain.  Had gone to the beach to winterize her camper.  Was going down 3 steps and slid.  No LOC.  Safe at home.  L knee hyperflexed when she went down.  This was about 3.5 weeks ago.  Taking ibuprofen and using ibuprofen in the meantime.  It was mildly puffy, not now.  Had minimal bruising, that resolved.  Pain with extension and still has medial soreness.  Can walk but with pain.  Pain going down steps.  Hasn't tried using a brace yet.  She is still doing water aerobics at the [facility].    Meds, vitals, and allergies reviewed.   ROS: Per HPI unless specifically indicated in ROS section

## 2024-05-17 NOTE — Patient Instructions (Signed)
 Try using diclofenac  gel in the meantime.  Ice as needed.  Let me know if that isn't helping.  Xray on the way out.  Take care.  Glad to see you.

## 2024-05-18 ENCOUNTER — Telehealth: Payer: Self-pay | Admitting: Family Medicine

## 2024-05-18 DIAGNOSIS — M25569 Pain in unspecified knee: Secondary | ICD-10-CM | POA: Insufficient documentation

## 2024-05-18 NOTE — Telephone Encounter (Signed)
 Please get update on patient.  Let me know Voltaren  gel is not helping.  I am awaiting the overread on her x-ray.  I looked at the films and I do not see any fracture or acute changes.  Thanks.

## 2024-05-18 NOTE — Assessment & Plan Note (Signed)
 Concern for medial soft tissue strain.  Discussed anatomy. Try using diclofenac  gel in the meantime.  Ice as needed.  Let me know if that isn't helping.  Xray on the way out.  She agrees to plan.  Okay for outpatient follow-up.

## 2024-05-19 NOTE — Telephone Encounter (Signed)
 Called patient reviewed all information and repeated back to me. Will call if any questions.  Started on Voltaren  last night. Pain is the same she has not had any change yet.

## 2024-05-19 NOTE — Telephone Encounter (Addendum)
 I am hopeful that voltaren  gel will help over the next few days.  If not, then please let me know.  Thanks.

## 2024-05-22 ENCOUNTER — Ambulatory Visit: Payer: Self-pay | Admitting: Family Medicine

## 2024-05-22 NOTE — Telephone Encounter (Signed)
 Message sent via MyChart.

## 2024-05-29 NOTE — Progress Notes (Unsigned)
" ° ° ° °  Andre Swander T. Alita Waldren, MD, CAQ Sports Medicine [facility] [address] [place], [address]  Phone: [phone]  FAX: [phone]  Cristina Hayes - 63 y.o. female  MRN [id number]  Date of Birth: Jan 14, 1961  Date: 05/30/2024  PCP: Cleatus Arlyss RAMAN, MD  Referral: Cleatus Arlyss RAMAN, MD  No chief complaint on file.  Subjective:   Cristina Hayes is a 63 y.o. very pleasant female patient with There is no height or weight on file to calculate BMI. who presents with the following:  Discussed the use of AI scribe software for clinical note transcription with the patient, who gave verbal consent to proceed.  Patient presents with ongoing left-sided knee pain.  She saw her primary care doctor roughly 2 weeks ago, at that point radiographs showed minor degenerative change only. -He had her use some topical Voltaren . History of Present Illness     Review of Systems is noted in the HPI, as appropriate  Objective:   There were no vitals taken for this visit.  GEN: No acute distress; alert,appropriate. PULM: Breathing comfortably in no respiratory distress PSYCH: Normally interactive.   Laboratory and Imaging Data:  Assessment and Plan:   No diagnosis found. Assessment & Plan   Medication Management during today's office visit: No orders of the defined types were placed in this encounter.  There are no discontinued medications.  Orders placed today for conditions managed today: No orders of the defined types were placed in this encounter.   Disposition: No follow-ups on file.  Dr[employer] One speech-to-text software was used for transcription in this dictation.  Possible transcriptional errors can occur using Animal nutritionist.   Signed,  Jacques DASEN. [name], MD   Outpatient Encounter Medications as of 05/30/2024  Medication Sig   diclofenac  Sodium (VOLTAREN ) 1 % GEL Apply 2 g topically 4 (four) times daily as needed.    famotidine  (PEPCID ) 20 MG tablet Take 20 mg by mouth 2 (two) times daily as needed for heartburn or indigestion.   No facility-administered encounter medications on file as of 05/30/2024.   "

## 2024-05-30 ENCOUNTER — Encounter: Payer: Self-pay | Admitting: Family Medicine

## 2024-05-30 ENCOUNTER — Ambulatory Visit: Admitting: Family Medicine

## 2024-05-30 VITALS — BP 140/64 | HR 76 | Temp 97.8°F | Ht 64.25 in | Wt 170.2 lb

## 2024-05-30 DIAGNOSIS — M25562 Pain in left knee: Secondary | ICD-10-CM

## 2024-05-30 MED ORDER — CELECOXIB 200 MG PO CAPS: 200.0000 mg | ORAL_CAPSULE | Freq: Every day | ORAL | 2 refills | Status: AC

## 2024-06-29 NOTE — Progress Notes (Signed)
 "    Providencia Hottenstein T. Nymir Ringler, MD, CAQ Sports Medicine [facility] [address] [place], [address]  Phone: [phone]  FAX: [phone]  Cristina Hayes - 64 y.o. female  MRN [id number]  Date of Birth: Nov 30, 1960  Date: 06/30/2024  PCP: Cleatus Arlyss RAMAN, MD  Referral: Cleatus Arlyss RAMAN, MD  Chief Complaint  Patient presents with   Knee Pain    Left-Follow Up   Subjective:   Cristina Hayes is a 64 y.o. very pleasant female patient with Body mass index is 29.46 kg/m. who presents with the following:  Discussed the use of AI scribe software for clinical note transcription with the patient, who gave verbal consent to proceed.  This is a pleasant 64 year old female who I saw 1 month ago with some ongoing acute left-sided knee pain.  At that time, suspected internal derangement and pain in the medial knee, at that point I did place her in a patellar J brace and also started her on scheduled Celebrex .  We also reviewed workout modifications. History of Present Illness Cristina Hayes is a 64 year old female who presents with left knee pain.  She experiences a burning sensation in her left knee, particularly when moving it. The pain is not constant but occurs when she first gets up after sitting or lying down. She describes the pain as sharp and feels like something is 'ripping'.  The pain is primarily located on the joint line of the left knee and radiates up and down from there. It is not as severe on the outside of the knee. She experiences limping when walking and has to 'give into it' initially when starting to walk.  These symptoms have been present for about two months. She has not been doing chair aerobics as it seems to exacerbate the pain, but she continues with water aerobics three times a week, which does not worsen the condition.    Review of Systems is noted in the HPI, as appropriate  Objective:   BP 130/68   Pulse  94   Temp 98.4 F (36.9 C) (Temporal)   Ht 5' 4.25 (1.632 m)   Wt 173 lb (78.5 kg)   SpO2 95%   BMI 29.46 kg/m   GEN: No acute distress; alert,appropriate. PULM: Breathing comfortably in no respiratory distress PSYCH: Normally interactive.   Left knee: Minimal effusion Minimal to mild pain with loading the patellar facets Full extension, however extension does cause significant pain Flexion to 125, mild pain Notable medial joint line and posterior medial joint line tenderness with minimal along the lateral joint line No significant patellar or quadricep tendon tenderness No tenderness at the tibial plateau or tibial tubercle  Flexion pinch and bounce home testing because positive pain McMurray's causes minimal pain ACL, PCL, MCL, and LCL are all stable  Laboratory and Imaging Data:  Assessment and Plan:     ICD-10-CM   1. Acute internal derangement of left knee  M23.92 Ambulatory referral to Physical Therapy    triamcinolone  acetonide (KENALOG -40) injection 40 mg    2. Acute pain of left knee  M25.562 Ambulatory referral to Physical Therapy    triamcinolone  acetonide (KENALOG -40) injection 40 mg    3. Primary osteoarthritis of left knee  M17.12 Ambulatory referral to Physical Therapy    triamcinolone  acetonide (KENALOG -40) injection 40 mg     Assessment & Plan Probable degenerative meniscal tear and osteoarthritis of the right knee Chronic sharp pain in  the right knee joint line, likely due to degenerative meniscal tear and osteoarthritis. Conservative management preferred. - Administered joint injection to reduce inflammation. - Referred to physical therapy in [place].  Aspiration/Injection Procedure Note Cristina Hayes 21-Nov-1960 Date of procedure: 06/30/2024  Procedure: Large Joint Aspiration / Injection of Knee, L Indications: Pain  Procedure Details Patient verbally consented to procedure. Risks, benefits, and alternatives explained. Sterilely  prepped with Chloraprep. Ethyl cholride used for anesthesia. 9 cc Lidocaine 1% mixed with 1 mL of Kenalog  40 mg injected using the anteromedial approach without difficulty. No complications with procedure and tolerated well. Patient had decreased pain post-injection. Medication: 1 mL of Kenalog  40 mg   Medication Management during today's office visit: Meds ordered this encounter  Medications   triamcinolone  acetonide (KENALOG -40) injection 40 mg   Medications Discontinued During This Encounter  Medication Reason   diclofenac  Sodium (VOLTAREN ) 1 % GEL Completed Course    Orders placed today for conditions managed today: Orders Placed This Encounter  Procedures   Ambulatory referral to Physical Therapy    Disposition: No follow-ups on file.  [employer] One speech-to-text software was used for transcription in this dictation.  Possible transcriptional errors can occur using Animal nutritionist.   Signed,  Jacques DASEN. Tashina Credit, MD   Outpatient Encounter Medications as of 06/30/2024  Medication Sig   celecoxib  (CELEBREX ) 200 MG capsule Take 1 capsule (200 mg total) by mouth daily.   famotidine  (PEPCID ) 20 MG tablet Take 20 mg by mouth 2 (two) times daily as needed for heartburn or indigestion.   [DISCONTINUED] diclofenac  Sodium (VOLTAREN ) 1 % GEL Apply 2 g topically 4 (four) times daily as needed.   [EXPIRED] triamcinolone  acetonide (KENALOG -40) injection 40 mg    No facility-administered encounter medications on file as of 06/30/2024.   "

## 2024-06-30 ENCOUNTER — Encounter: Payer: Self-pay | Admitting: Family Medicine

## 2024-06-30 ENCOUNTER — Ambulatory Visit: Admitting: Family Medicine

## 2024-06-30 VITALS — BP 130/68 | HR 94 | Temp 98.4°F | Ht 64.25 in | Wt 173.0 lb

## 2024-06-30 DIAGNOSIS — M25562 Pain in left knee: Secondary | ICD-10-CM | POA: Diagnosis not present

## 2024-06-30 DIAGNOSIS — M2392 Unspecified internal derangement of left knee: Secondary | ICD-10-CM | POA: Diagnosis not present

## 2024-06-30 DIAGNOSIS — M1712 Unilateral primary osteoarthritis, left knee: Secondary | ICD-10-CM | POA: Diagnosis not present

## 2024-06-30 MED ORDER — TRIAMCINOLONE ACETONIDE 40 MG/ML IJ SUSP
40.0000 mg | Freq: Once | INTRAMUSCULAR | Status: AC
  Administered 2024-06-30: 40 mg via INTRA_ARTICULAR

## 2024-07-06 ENCOUNTER — Ambulatory Visit: Attending: Family Medicine

## 2024-07-06 DIAGNOSIS — M25362 Other instability, left knee: Secondary | ICD-10-CM | POA: Diagnosis present

## 2024-07-06 DIAGNOSIS — M1712 Unilateral primary osteoarthritis, left knee: Secondary | ICD-10-CM | POA: Insufficient documentation

## 2024-07-06 DIAGNOSIS — M6281 Muscle weakness (generalized): Secondary | ICD-10-CM | POA: Insufficient documentation

## 2024-07-06 DIAGNOSIS — M2392 Unspecified internal derangement of left knee: Secondary | ICD-10-CM | POA: Diagnosis not present

## 2024-07-06 DIAGNOSIS — M25562 Pain in left knee: Secondary | ICD-10-CM | POA: Diagnosis present

## 2024-07-06 NOTE — Therapy (Signed)
 " OUTPATIENT PHYSICAL THERAPY LOWER EXTREMITY EVALUATION   Patient Name: Cristina Hayes MRN: [id number] DOB:1960-08-27, 64 y.o., female Today's Date: 07/06/2024  END OF SESSION:  PT End of Session - 07/06/24 1600     Visit Number 1    Number of Visits 17    Date for Recertification  08/31/24    Authorization Type Medical Necessity    PT Start Time 1556    PT Stop Time 1645    PT Time Calculation (min) 49 min          Past Medical History:  Diagnosis Date   History of chicken pox    Hyperlipidemia    Past Surgical History:  Procedure Laterality Date   ABLATION     uterine ablation   COLONOSCOPY     OVARIAN CYST REMOVAL     TUBAL LIGATION     Patient Active Problem List   Diagnosis Date Noted   Knee pain 05/18/2024   Welcome to Medicare preventive visit 10/07/2023   GERD (gastroesophageal reflux disease) 09/10/2022   Advance care planning 09/04/2021   Atrophic vaginitis 06/26/2020   HLD (hyperlipidemia) 03/26/2010    PCP: Cleatus Arlyss RAMAN, MD   REFERRING PROVIDER: Watt Mirza, MD   REFERRING DIAG:   [phone] (ICD-10-CM) - Acute pain of left knee  M23.92 (ICD-10-CM) - Acute internal derangement of left knee  M17.12 (ICD-10-CM) - Primary osteoarthritis of left knee     THERAPY DIAG:  Acute pain of left knee  Muscle weakness (generalized)  Knee instability, left  Rationale for Evaluation and Treatment: Rehabilitation  ONSET DATE: 04/23/24  SUBJECTIVE:   SUBJECTIVE STATEMENT: Pt reports that she fell from her camper on 04/23/24.  Pt reports that when she went down, the L LE bent backwards.  Pt notes that she went to the [facility] and they performed X-Rays and found no complications within the joint.  Pt has been having increased pain since that point in time.  Pt notes the pain to be more medial in nature and in a c shape from the inferior to the superior medial portion of the knee.   Pt denies any lateral joint line pain of the L knee.     PERTINENT HISTORY: Cristina Hayes is a 64 year old female who presents with left knee pain.   She experiences a burning sensation in her left knee, particularly when moving it. The pain is not constant but occurs when she first gets up after sitting or lying down. She describes the pain as sharp and feels like something is 'ripping'.   The pain is primarily located on the joint line of the left knee and radiates up and down from there. It is not as severe on the outside of the knee. She experiences limping when walking and has to 'give into it' initially when starting to walk.   These symptoms have been present for about two months. She has not been doing chair aerobics as it seems to exacerbate the pain, but she continues with water aerobics three times a week, which does not worsen the condition. PAIN:  Are you having pain? Yes: NPRS scale: 2/10 current; 7/10 at worst over past 7 days Pain location: medial L knee Pain description: Pt notes burning/stinging pain Aggravating factors: Being up on the LE for a prolonged period of time  Relieving factors: Heat/Ice help in the moment in easing the pain  PRECAUTIONS: None  RED FLAGS: None   WEIGHT BEARING RESTRICTIONS: No  FALLS:  Has patient fallen in last 6 months? Yes. 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    NEXT MD VISIT: August 22, 2024  OBJECTIVE:  Note: Objective measures were completed at Evaluation unless otherwise noted.  DIAGNOSTIC FINDINGS:   EXAM: LEFT KNEE - COMPLETE 4+ VIEW  FINDINGS: Normal left knee alignment without acute osseous finding, fracture, or large effusion. Slight degenerative change with joint  space loss and bony spurring of the lateral and patellofemoral compartments. Soft tissues unremarkable.   IMPRESSION: Minor degenerative changes as above. No acute finding by plain radiography.  PATIENT SURVEYS:  LEFS: TBD at next visit   COGNITION: Overall cognitive status: Within functional limits for tasks assessed     SENSATION: WFL  PALPATION: Pt with joint line tenderness on the medial aspect of the L knee.  Not other complications around the knee.    LOWER EXTREMITY ROM:  Active ROM Right eval Left eval  Hip flexion    Hip extension    Hip abduction    Hip adduction    Hip internal rotation    Hip external rotation    Knee flexion 130 131  Knee extension -2 +1  Ankle dorsiflexion    Ankle plantarflexion    Ankle inversion    Ankle eversion     (Blank rows = not tested)  LOWER EXTREMITY MMT:  MMT Right eval Left eval  Hip flexion 4- 4-  Hip extension    Hip abduction 4 4  Hip adduction 4 4  Hip internal rotation 4+ 4-  Hip external rotation 4+ 4-*  Knee flexion 4- 4-*  Knee extension 4 4  Ankle dorsiflexion    Ankle plantarflexion    Ankle inversion    Ankle eversion     (Blank rows = not tested)  LOWER EXTREMITY SPECIAL TESTS:  Knee special tests: Apley's test: negative, McMurray's test: negative, Thessaly test: negative, Patellafemoral apprehension test: negative, and Patellafemoral grind test: negative  FUNCTIONAL TESTS:  5 times sit to stand: 10.80 sec; no pain                                                                                                                                TREATMENT DATE: 07/06/2024  Evaluation    TherEx:  Generated HEP and pt performed the exercises with verbal cues and visual demonstration of the exercises.  Standing mini-squats with UE support on mat, x10 Standing forward lunge with UE support on mat, 5 each direction Standing marches, x5 each LE Standing side lunge with UE support on plinth, x5  each side     PATIENT EDUCATION:  Education details: Pt educated  on role of PT and services provided during current POC, along with prognosis and information about the clinic.  Person educated: Patient Education method: Explanation, Demonstration, Tactile cues, Verbal cues, and Handouts Education comprehension: verbalized understanding, returned demonstration, and verbal cues required  HOME EXERCISE PROGRAM: Access Code: [email or web address] URL: [email or web address] Date: 07/06/2024 Prepared by: Sidra Simpers  Exercises - Mini Squat with Counter Support  - 1 x daily - 7 x weekly - 3 sets - 10 reps - Forward Lunge with Back Leg Straight and Counter Support  - 1 x daily - 7 x weekly - 3 sets - 10 reps - Standing March with Unilateral Counter Support  - 1 x daily - 7 x weekly - 3 sets - 10 reps - Side Lunge with Counter Support  - 1 x daily - 7 x weekly - 3 sets - 10 reps  ASSESSMENT:  CLINICAL IMPRESSION: Patient is a 64 y.o. female who was seen today for physical therapy evaluation and treatment for L knee pain.   Patient describes left medial knee pain upon arrival to the clinic.  Upon description of the pain it seems to be more meniscal related, however after further testing this is unlikely.  Patient does have mild instability of the left lower extremity as well as weakness in bilateral lower extremities.  This is likely the root cause of the pain that she is experiencing in the left knee, and will be addressed with strengthening exercises and skilled therapy to improve overall stability of the left knee.  Pt will continue to benefit from skilled therapy to address remaining deficits in order to improve overall QoL and return to PLOF.     OBJECTIVE IMPAIRMENTS: decreased activity tolerance, decreased strength, impaired flexibility, and pain.   ACTIVITY LIMITATIONS: standing and squatting  PARTICIPATION LIMITATIONS: cleaning, laundry, community activity, and  occupation  PERSONAL FACTORS: Age and Fitness are also affecting patient's functional outcome.   REHAB POTENTIAL: Good  CLINICAL DECISION MAKING: Stable/uncomplicated  EVALUATION COMPLEXITY: Low   GOALS: Goals reviewed with patient? Yes  SHORT TERM GOALS: Target date: 08/03/2024  Pt will be independent with HEP in order to demonstrate increased ability to perform tasks related to occupation/hobbies. Baseline: Goal status: INITIAL    LONG TERM GOALS: Target date: 08/31/2024  Patient will improve the LEFS by 11% in order to demonstrate functional independence without pain. Baseline: TBD at next visit Goal status: INITIAL  2.  Pt will reduce worst pain level to 2/10 by utilizing a combination of stretching, strengthening exercises, and pain-reducing modalities in order to improve overall QoL and be able to return to chair yoga without discomfort. Baseline: Pt reports 7/10 pain at worst Goal status: INITIAL  3.  Pt will increase strength of B LE by at least 1/2 MMT grade in order to demonstrate improvement in strength and function  Baseline: Globally 4-/5 bilaterally as noted above Goal status: INITIAL    PLAN:  PT FREQUENCY: 1-2x/week  PT DURATION: 8 weeks  PLANNED INTERVENTIONS: 97750- Physical Performance Testing, 97110-Therapeutic exercises, 97530- Therapeutic activity, 97112- Neuromuscular re-education, 97535- Self Care, [id number]- Manual therapy, 20560 (1-2 muscles), 20561 (3+ muscles)- Dry Needling, Patient/Family education, Joint mobilization, Cryotherapy, and Moist heat  PLAN FOR NEXT SESSION:   LEFS, Assess HEP compliance, continue with knee strengthening exercises     Fonda Simpers, PT, DPT Physical Therapist - [facility]  07/06/24, 7:06 PM  "

## 2024-07-11 ENCOUNTER — Ambulatory Visit

## 2024-07-18 ENCOUNTER — Ambulatory Visit

## 2024-07-20 ENCOUNTER — Ambulatory Visit

## 2024-07-26 ENCOUNTER — Ambulatory Visit

## 2024-08-03 ENCOUNTER — Ambulatory Visit

## 2024-08-09 ENCOUNTER — Ambulatory Visit

## 2024-08-11 ENCOUNTER — Ambulatory Visit

## 2024-08-16 ENCOUNTER — Ambulatory Visit

## 2024-08-18 ENCOUNTER — Ambulatory Visit

## 2024-08-22 ENCOUNTER — Ambulatory Visit: Admitting: Family Medicine

## 2024-08-22 ENCOUNTER — Ambulatory Visit

## 2024-08-24 ENCOUNTER — Ambulatory Visit
